# Patient Record
Sex: Female | Born: 1955 | Race: White | Hispanic: No | Marital: Single | Smoking: Never smoker
Health system: Southern US, Community
[De-identification: ages and names within clinical notes are randomized; demographics above are authoritative.]

## PROBLEM LIST (undated history)

## (undated) DIAGNOSIS — N83201 Unspecified ovarian cyst, right side: Secondary | ICD-10-CM

## (undated) DIAGNOSIS — N83202 Unspecified ovarian cyst, left side: Secondary | ICD-10-CM

## (undated) DIAGNOSIS — F329 Major depressive disorder, single episode, unspecified: Secondary | ICD-10-CM

## (undated) DIAGNOSIS — F419 Anxiety disorder, unspecified: Secondary | ICD-10-CM

## (undated) DIAGNOSIS — K219 Gastro-esophageal reflux disease without esophagitis: Secondary | ICD-10-CM

## (undated) DIAGNOSIS — B001 Herpesviral vesicular dermatitis: Secondary | ICD-10-CM

## (undated) DIAGNOSIS — G250 Essential tremor: Secondary | ICD-10-CM

## (undated) DIAGNOSIS — M1990 Unspecified osteoarthritis, unspecified site: Secondary | ICD-10-CM

## (undated) DIAGNOSIS — Z8719 Personal history of other diseases of the digestive system: Secondary | ICD-10-CM

## (undated) DIAGNOSIS — E785 Hyperlipidemia, unspecified: Secondary | ICD-10-CM

## (undated) DIAGNOSIS — F32A Depression, unspecified: Secondary | ICD-10-CM

## (undated) DIAGNOSIS — R251 Tremor, unspecified: Secondary | ICD-10-CM

## (undated) DIAGNOSIS — Z973 Presence of spectacles and contact lenses: Secondary | ICD-10-CM

## (undated) HISTORY — PX: ABDOMINAL HYSTERECTOMY: SHX81

## (undated) HISTORY — PX: JOINT REPLACEMENT: SHX530

## (undated) HISTORY — DX: Tremor, unspecified: R25.1

## (undated) HISTORY — DX: Major depressive disorder, single episode, unspecified: F32.9

## (undated) HISTORY — PX: COSMETIC SURGERY: SHX468

## (undated) HISTORY — PX: BREAST SURGERY: SHX581

## (undated) HISTORY — DX: Hyperlipidemia, unspecified: E78.5

## (undated) HISTORY — PX: OTHER SURGICAL HISTORY: SHX169

## (undated) HISTORY — DX: Herpesviral vesicular dermatitis: B00.1

## (undated) HISTORY — DX: Depression, unspecified: F32.A

---

## 1997-06-10 HISTORY — PX: BREAST ENHANCEMENT SURGERY: SHX7

## 1998-05-18 ENCOUNTER — Other Ambulatory Visit: Admission: RE | Admit: 1998-05-18 | Discharge: 1998-05-18 | Payer: Self-pay | Admitting: Obstetrics and Gynecology

## 1998-06-10 HISTORY — PX: BREAST SURGERY: SHX581

## 1998-07-03 ENCOUNTER — Inpatient Hospital Stay (HOSPITAL_COMMUNITY): Admission: RE | Admit: 1998-07-03 | Discharge: 1998-07-04 | Payer: Self-pay | Admitting: Obstetrics and Gynecology

## 1998-07-04 ENCOUNTER — Encounter: Payer: Self-pay | Admitting: Obstetrics and Gynecology

## 1999-04-10 ENCOUNTER — Ambulatory Visit (HOSPITAL_BASED_OUTPATIENT_CLINIC_OR_DEPARTMENT_OTHER): Admission: RE | Admit: 1999-04-10 | Discharge: 1999-04-10 | Payer: Self-pay | Admitting: Surgery

## 1999-06-11 HISTORY — PX: ABDOMINAL HYSTERECTOMY: SHX81

## 1999-06-11 HISTORY — PX: LAPAROSCOPIC ASSISTED VAGINAL HYSTERECTOMY: SHX5398

## 1999-06-14 ENCOUNTER — Other Ambulatory Visit: Admission: RE | Admit: 1999-06-14 | Discharge: 1999-06-14 | Payer: Self-pay | Admitting: Obstetrics and Gynecology

## 2000-07-24 ENCOUNTER — Other Ambulatory Visit: Admission: RE | Admit: 2000-07-24 | Discharge: 2000-07-24 | Payer: Self-pay | Admitting: Obstetrics and Gynecology

## 2000-12-17 LAB — HM DEXA SCAN: HM Dexa Scan: NORMAL

## 2001-08-04 ENCOUNTER — Other Ambulatory Visit: Admission: RE | Admit: 2001-08-04 | Discharge: 2001-08-04 | Payer: Self-pay | Admitting: Obstetrics and Gynecology

## 2002-08-03 ENCOUNTER — Other Ambulatory Visit: Admission: RE | Admit: 2002-08-03 | Discharge: 2002-08-03 | Payer: Self-pay | Admitting: Obstetrics and Gynecology

## 2004-01-30 ENCOUNTER — Other Ambulatory Visit: Admission: RE | Admit: 2004-01-30 | Discharge: 2004-01-30 | Payer: Self-pay | Admitting: Obstetrics and Gynecology

## 2005-02-19 ENCOUNTER — Other Ambulatory Visit: Admission: RE | Admit: 2005-02-19 | Discharge: 2005-02-19 | Payer: Self-pay | Admitting: Obstetrics and Gynecology

## 2005-06-10 HISTORY — PX: BLEPHAROPLASTY: SUR158

## 2009-06-10 HISTORY — PX: COLONOSCOPY: SHX174

## 2010-02-27 LAB — HM COLONOSCOPY

## 2010-12-03 ENCOUNTER — Emergency Department (HOSPITAL_COMMUNITY)
Admission: EM | Admit: 2010-12-03 | Discharge: 2010-12-03 | Disposition: A | Discharge status: Home / Self Care | Payer: BC Managed Care – PPO | Attending: Emergency Medicine | Admitting: Emergency Medicine

## 2010-12-03 DIAGNOSIS — K2970 Gastritis, unspecified, without bleeding: Secondary | ICD-10-CM | POA: Insufficient documentation

## 2010-12-05 ENCOUNTER — Other Ambulatory Visit: Payer: Self-pay | Admitting: Emergency Medicine

## 2010-12-05 DIAGNOSIS — R110 Nausea: Secondary | ICD-10-CM

## 2010-12-07 ENCOUNTER — Ambulatory Visit
Admission: RE | Admit: 2010-12-07 | Discharge: 2010-12-07 | Disposition: A | Discharge status: Home / Self Care | Payer: BC Managed Care – PPO | Source: Ambulatory Visit | Attending: Emergency Medicine | Admitting: Emergency Medicine

## 2010-12-07 DIAGNOSIS — R110 Nausea: Secondary | ICD-10-CM

## 2011-04-18 ENCOUNTER — Encounter: Payer: Self-pay | Admitting: Emergency Medicine

## 2011-04-18 DIAGNOSIS — F32A Depression, unspecified: Secondary | ICD-10-CM | POA: Insufficient documentation

## 2011-04-18 DIAGNOSIS — R251 Tremor, unspecified: Secondary | ICD-10-CM | POA: Insufficient documentation

## 2011-04-18 DIAGNOSIS — E785 Hyperlipidemia, unspecified: Secondary | ICD-10-CM | POA: Insufficient documentation

## 2011-04-18 DIAGNOSIS — B001 Herpesviral vesicular dermatitis: Secondary | ICD-10-CM | POA: Insufficient documentation

## 2011-04-18 DIAGNOSIS — F329 Major depressive disorder, single episode, unspecified: Secondary | ICD-10-CM | POA: Insufficient documentation

## 2011-10-14 ENCOUNTER — Other Ambulatory Visit: Payer: Self-pay | Admitting: Emergency Medicine

## 2011-10-14 ENCOUNTER — Other Ambulatory Visit: Payer: Self-pay

## 2011-10-14 MED ORDER — ALPRAZOLAM 0.5 MG PO TABS: 0.5000 mg | ORAL_TABLET | Freq: Every evening | ORAL | Status: DC | PRN

## 2011-10-15 NOTE — Telephone Encounter (Signed)
Needs ov

## 2011-11-16 ENCOUNTER — Other Ambulatory Visit: Payer: Self-pay | Admitting: Emergency Medicine

## 2011-11-19 ENCOUNTER — Other Ambulatory Visit: Payer: Self-pay | Admitting: Physician Assistant

## 2011-11-19 MED ORDER — ALPRAZOLAM 0.5 MG PO TABS: 0.5000 mg | ORAL_TABLET | Freq: Every evening | ORAL | Status: DC | PRN

## 2011-12-11 ENCOUNTER — Ambulatory Visit (INDEPENDENT_AMBULATORY_CARE_PROVIDER_SITE_OTHER): Payer: BC Managed Care – PPO | Admitting: Emergency Medicine

## 2011-12-11 VITALS — BP 128/82 | HR 84 | Temp 98.1°F | Resp 16 | Ht 64.5 in | Wt 129.0 lb

## 2011-12-11 DIAGNOSIS — R079 Chest pain, unspecified: Secondary | ICD-10-CM

## 2011-12-11 DIAGNOSIS — F329 Major depressive disorder, single episode, unspecified: Secondary | ICD-10-CM

## 2011-12-11 DIAGNOSIS — F32A Depression, unspecified: Secondary | ICD-10-CM

## 2011-12-11 DIAGNOSIS — B009 Herpesviral infection, unspecified: Secondary | ICD-10-CM

## 2011-12-11 DIAGNOSIS — K219 Gastro-esophageal reflux disease without esophagitis: Secondary | ICD-10-CM

## 2011-12-11 DIAGNOSIS — R059 Cough, unspecified: Secondary | ICD-10-CM

## 2011-12-11 DIAGNOSIS — R05 Cough: Secondary | ICD-10-CM

## 2011-12-11 DIAGNOSIS — J329 Chronic sinusitis, unspecified: Secondary | ICD-10-CM

## 2011-12-11 DIAGNOSIS — F3289 Other specified depressive episodes: Secondary | ICD-10-CM

## 2011-12-11 MED ORDER — BUPROPION HCL ER (XL) 300 MG PO TB24: 300.0000 mg | ORAL_TABLET | ORAL | Status: DC

## 2011-12-11 MED ORDER — ALPRAZOLAM 0.5 MG PO TABS: 0.5000 mg | ORAL_TABLET | Freq: Every evening | ORAL | Status: DC | PRN

## 2011-12-11 MED ORDER — VALACYCLOVIR HCL 1 G PO TABS: 1000.0000 mg | ORAL_TABLET | Freq: Every day | ORAL | Status: DC

## 2011-12-11 MED ORDER — LEVOFLOXACIN 500 MG PO TABS: 500.0000 mg | ORAL_TABLET | Freq: Every day | ORAL | Status: AC

## 2011-12-11 MED ORDER — FLUTICASONE PROPIONATE 50 MCG/ACT NA SUSP: 2.0000 | Freq: Every day | NASAL | Status: DC

## 2011-12-11 MED ORDER — ALBUTEROL SULFATE (2.5 MG/3ML) 0.083% IN NEBU
2.5000 mg | INHALATION_SOLUTION | Freq: Once | RESPIRATORY_TRACT | Status: AC
  Administered 2011-12-11: 2.5 mg via RESPIRATORY_TRACT

## 2011-12-11 MED ORDER — BENZONATATE 200 MG PO CAPS: 200.0000 mg | ORAL_CAPSULE | Freq: Two times a day (BID) | ORAL | Status: AC | PRN

## 2011-12-11 NOTE — Progress Notes (Signed)
Subjective:    Patient ID: Yvette Ferguson, female    DOB: 04-27-1956, 56 y.o.   MRN: [id number]  HPI patient has been sick for at least one week she has chest congestion head congestion significant cough. She denies been ill with fever chills or other symptoms she states no one else at home is sick.  She has not had a fever.    Review of Systems     Objective:   Physical Exam  Constitutional: She appears well-developed and well-nourished.  Eyes: Pupils are equal, round, and reactive to light.  Neck: No thyromegaly present.  Cardiovascular: Normal rate and regular rhythm.   Pulmonary/Chest: Effort normal. No respiratory distress. She has wheezes.       There is mild prolongation of expiration with a peak flow of 390.          Assessment & Plan:   Testing patient did not have any improvement with her and nebulizer. We'll treat with Levaquin 500 one a day Flonase and Tessalon Perles. We'll treat it as a sinusitis

## 2011-12-11 NOTE — Patient Instructions (Addendum)

## 2011-12-23 ENCOUNTER — Other Ambulatory Visit: Payer: Self-pay | Admitting: Gastroenterology

## 2011-12-23 DIAGNOSIS — R110 Nausea: Secondary | ICD-10-CM

## 2011-12-23 DIAGNOSIS — R1013 Epigastric pain: Secondary | ICD-10-CM

## 2011-12-25 ENCOUNTER — Ambulatory Visit
Admission: RE | Admit: 2011-12-25 | Discharge: 2011-12-25 | Disposition: A | Discharge status: Home / Self Care | Payer: BC Managed Care – PPO | Source: Ambulatory Visit | Attending: Gastroenterology | Admitting: Gastroenterology

## 2011-12-25 DIAGNOSIS — R110 Nausea: Secondary | ICD-10-CM

## 2011-12-25 DIAGNOSIS — R1013 Epigastric pain: Secondary | ICD-10-CM

## 2011-12-27 ENCOUNTER — Telehealth: Payer: Self-pay

## 2011-12-27 NOTE — Telephone Encounter (Signed)
Pt CB and stated that she has seen Dr Elnoria Howard and he had gotten the [place] results so she was aware of them. He wants her to cont on Prilosec and f/up w him on 01/22/12

## 2011-12-27 NOTE — Telephone Encounter (Signed)
LMOM to CB. Dr Cleta Alberts wanted [place] to call pt w/normal [place] Abd results - no sign of gallstones (results at [facility] desk), and to check if she has seen the GI yet. In [facility] looks like she saw GI and he has ordered an [place] Abd. Does he think pt needs another one, or do we need to send results to GI?

## 2012-04-26 ENCOUNTER — Other Ambulatory Visit: Payer: Self-pay | Admitting: Emergency Medicine

## 2012-05-06 ENCOUNTER — Telehealth: Payer: Self-pay | Admitting: *Deleted

## 2012-05-06 ENCOUNTER — Ambulatory Visit: Payer: BC Managed Care – PPO | Admitting: Family Medicine

## 2012-05-06 DIAGNOSIS — F329 Major depressive disorder, single episode, unspecified: Secondary | ICD-10-CM

## 2012-05-06 DIAGNOSIS — F32A Depression, unspecified: Secondary | ICD-10-CM

## 2012-05-06 MED ORDER — BUPROPION HCL ER (XL) 300 MG PO TB24: 300.0000 mg | ORAL_TABLET | ORAL | Status: DC

## 2012-05-06 NOTE — Telephone Encounter (Signed)
Rx done. 

## 2012-05-06 NOTE — Telephone Encounter (Signed)
Thanks, I have pended this for patient. See previous.

## 2012-05-06 NOTE — Telephone Encounter (Signed)
Patient advised.

## 2012-05-06 NOTE — Telephone Encounter (Signed)
Pt. Had an 11:45 appt. with Dr. Audria Nine today, I had to call pt to rescheduled her appt. due to the Dr. cancelling her appointments today. Pt stated that she had tried to refill her wellbutrin and they told her she needed an appt so she made this one. Pt has been without her medication now for three days and we rescheduled her appointment for Dec 4th. Pt needs a refill on this med before then. I told her I would try to help out due to this was not her fault her appt was cancelled for today.

## 2012-05-13 ENCOUNTER — Encounter: Payer: Self-pay | Admitting: Family Medicine

## 2012-05-13 ENCOUNTER — Ambulatory Visit (INDEPENDENT_AMBULATORY_CARE_PROVIDER_SITE_OTHER): Payer: BC Managed Care – PPO | Admitting: Family Medicine

## 2012-05-13 VITALS — BP 133/86 | HR 81 | Temp 98.0°F | Resp 16 | Ht 64.0 in | Wt 132.0 lb

## 2012-05-13 DIAGNOSIS — F3289 Other specified depressive episodes: Secondary | ICD-10-CM

## 2012-05-13 DIAGNOSIS — R1013 Epigastric pain: Secondary | ICD-10-CM | POA: Insufficient documentation

## 2012-05-13 DIAGNOSIS — F329 Major depressive disorder, single episode, unspecified: Secondary | ICD-10-CM

## 2012-05-13 DIAGNOSIS — K30 Functional dyspepsia: Secondary | ICD-10-CM | POA: Insufficient documentation

## 2012-05-13 DIAGNOSIS — Z780 Asymptomatic menopausal state: Secondary | ICD-10-CM | POA: Insufficient documentation

## 2012-05-13 DIAGNOSIS — F32A Depression, unspecified: Secondary | ICD-10-CM

## 2012-05-13 DIAGNOSIS — Z760 Encounter for issue of repeat prescription: Secondary | ICD-10-CM

## 2012-05-13 MED ORDER — BUPROPION HCL ER (XL) 300 MG PO TB24: 300.0000 mg | ORAL_TABLET | ORAL | Status: DC

## 2012-05-13 MED ORDER — ALPRAZOLAM 0.5 MG PO TABS: 0.5000 mg | ORAL_TABLET | Freq: Every evening | ORAL | Status: DC | PRN

## 2012-05-13 NOTE — Patient Instructions (Addendum)
For menopause symptoms- get an herbal supplement that contains Black cohosh, evening primrose oil and chastetree berry. Herbal supplements work best when combined with herbs that have similar actions in the body.  I have found that Flax Seed Oil 1000-1200 mg  1 capsule daily is helpful. This is also a good source of Omega-3 Fish Oil.   Menopause Menopause is the normal time of life when menstrual periods stop completely. Menopause is complete when you have missed 12 consecutive menstrual periods. It usually occurs between the ages of 73 to 65, with an average age of 28. Very rarely does a woman develop menopause before 55 years old. At menopause, your ovaries stop producing the female hormones, estrogen and progesterone. This can cause undesirable symptoms and also affect your health. Sometimes the symptoms may occur 4 to 5 years before the menopause begins. There is no relationship between menopause and:  Oral contraceptives.  Number of children you had.  Race.  The age your menstrual periods started (menarche). Heavy smokers and very thin women may develop menopause earlier in life. CAUSES  The ovaries stop producing the female hormones estrogen and progesterone.  Other causes include:  Surgery to remove both ovaries.  The ovaries stop functioning for no known reason.  Tumors of the pituitary gland in the brain.  Medical disease that affects the ovaries and hormone production.  Radiation treatment to the abdomen or pelvis.  Chemotherapy that affects the ovaries. SYMPTOMS   Hot flashes.  Night sweats.  Decrease in sex drive.  Vaginal dryness and thinning of the vagina causing painful intercourse.  Dryness of the skin and developing wrinkles.  Headaches.  Tiredness.  Irritability.  Memory problems.  Weight gain.  Bladder infections.  Hair growth of the face and chest.  Infertility. More serious symptoms include:  Loss of bone (osteoporosis) causing breaks  (fractures).  Depression.  Hardening and narrowing of the arteries (atherosclerosis) causing heart attacks and strokes. DIAGNOSIS   When the menstrual periods have stopped for 12 straight months.  Physical exam.  Hormone studies of the blood. TREATMENT  There are many treatment choices and nearly as many questions about them. The decisions to treat or not to treat menopausal changes is an individual choice made with your caregiver. Your caregiver can discuss the treatments with you. Together, you can decide which treatment will work best for you. Your treatment choices may include:   Hormone therapy (estorgen and progesterone).  Non-hormonal medications.  Treating the individual symptoms with medication (for example antidepressants for depression).  Herbal medications that may help specific symptoms.  Counseling by a psychiatrist or psychologist.  Group therapy.  Lifestyle changes including:  Eating healthy.  Regular exercise.  Limiting caffeine and alcohol.  Stress management and meditation.  No treatment. HOME CARE INSTRUCTIONS   Take the medication your caregiver gives you as directed.  Get plenty of sleep and rest.  Exercise regularly.  Eat a diet that contains calcium (good for the bones) and soy products (acts like estrogen hormone).  Avoid alcoholic beverages.  Do not smoke.  If you have hot flashes, dress in layers.  Take supplements, calcium and vitamin D to strengthen bones.  You can use over-the-counter lubricants or moisturizers for vaginal dryness.  Group therapy is sometimes very helpful.  Acupuncture may be helpful in some cases. SEEK MEDICAL CARE IF:   You are not sure you are in menopause.  You are having menopausal symptoms and need advice and treatment.  You are still having menstrual  periods after age 33.  You have pain with intercourse.  Menopause is complete (no menstrual period for 12 months) and you develop vaginal  bleeding.  You need a referral to a specialist (gynecologist, psychiatrist or psychologist) for treatment. SEEK IMMEDIATE MEDICAL CARE IF:   You have severe depression.  You have excessive vaginal bleeding.  You fell and think you have a broken bone.  You have pain when you urinate.  You develop leg or chest pain.  You have a fast pounding heart beat (palpitations).  You have severe headaches.  You develop vision problems.  You feel a lump in your breast.  You have abdominal pain or severe indigestion. Document Released: 08/17/2003 Document Revised: 08/19/2011 Document Reviewed: 03/24/2008 [facility] Patient Information 2013 [place], [place].

## 2012-05-13 NOTE — Progress Notes (Signed)
S:  Pt has chronic depression for years with increased symptoms during fall and winter. She has been on Bupropion since 2009. She is having no side effects w/ this medication. Menopause symptoms have been problematic for last 2 1/2 years. GYN physician has recommended Black cohosh. In general, she feels she is doing well handling life's challenges. Alprazolam helps with sleep 3 nights/week. She saw Dr. Elnoria Howard earlier this year and has been advised to stay on Prilosec; she did go off the med for several weeks but resumed it this week.  ROS: Negative for appetite change, weight loss, fatigue, CP or tightness, palpitations, SOB or DOE, abd pain or new GI symptoms, abnormal thoughts or behaviors, difficulty w/ focus, SI or irritability.  O:  Filed Vitals:   05/13/12 1206  BP: 133/86  Pulse: 81  Temp: 98 F (36.7 C)  Resp: 16   GEN: In NAD; WN,WD. HENT: [place]/AT; EOMI w/ clear conj/scl. Otherwise normal. COR: RRR. LUNGS: Normal resp rate and effort. NEURO: A&O x 3; CNs intact. Nonfocal.   A/P: 1. Depression  buPROPion (WELLBUTRIN XL) 300 MG 24 hr tablet, ALPRAZolam (XANAX) 0.5 MG tablet  2. Menopause  Advise given re: OTC herbal supplements.  3. Encounter for medication refill

## 2012-06-09 ENCOUNTER — Other Ambulatory Visit: Payer: Self-pay | Admitting: Physician Assistant

## 2012-07-13 ENCOUNTER — Other Ambulatory Visit: Payer: Self-pay | Admitting: Emergency Medicine

## 2012-11-11 ENCOUNTER — Encounter: Payer: Self-pay | Admitting: Emergency Medicine

## 2012-12-08 ENCOUNTER — Ambulatory Visit (INDEPENDENT_AMBULATORY_CARE_PROVIDER_SITE_OTHER): Payer: BC Managed Care – PPO | Admitting: Family Medicine

## 2012-12-08 VITALS — BP 144/83 | HR 66 | Temp 98.1°F | Resp 16 | Ht 65.0 in | Wt 124.0 lb

## 2012-12-08 DIAGNOSIS — F32A Depression, unspecified: Secondary | ICD-10-CM

## 2012-12-08 DIAGNOSIS — E785 Hyperlipidemia, unspecified: Secondary | ICD-10-CM

## 2012-12-08 DIAGNOSIS — M25572 Pain in left ankle and joints of left foot: Secondary | ICD-10-CM

## 2012-12-08 DIAGNOSIS — B009 Herpesviral infection, unspecified: Secondary | ICD-10-CM

## 2012-12-08 DIAGNOSIS — K2970 Gastritis, unspecified, without bleeding: Secondary | ICD-10-CM

## 2012-12-08 DIAGNOSIS — F329 Major depressive disorder, single episode, unspecified: Secondary | ICD-10-CM

## 2012-12-08 DIAGNOSIS — F3289 Other specified depressive episodes: Secondary | ICD-10-CM

## 2012-12-08 MED ORDER — ALPRAZOLAM 0.5 MG PO TABS: 0.5000 mg | ORAL_TABLET | Freq: Every evening | ORAL | Status: DC | PRN

## 2012-12-08 MED ORDER — BUPROPION HCL ER (XL) 300 MG PO TB24: 300.0000 mg | ORAL_TABLET | ORAL | Status: DC

## 2012-12-08 MED ORDER — VALACYCLOVIR HCL 1 G PO TABS: 1000.0000 mg | ORAL_TABLET | Freq: Every day | ORAL | Status: DC

## 2012-12-08 NOTE — Progress Notes (Signed)
[facility]:  Office Visit  Chief Complaint:  Chief Complaint  Patient presents with  . Foot Pain    Left foot painful noticed last night  . Medication Refill    Has appt with Dr. Cleta Alberts on 7/15, but wants to know if we can RF her meds today so she doesn't have to pay another copay-plus she doesn't have enough meds to last up until the 15    HPI: Yvette Ferguson is a 57 y.o. female who complains of: 1. Left foot pain-started last night NKI, she has a knot on the left lateral and also medial aspect and her ankle is swollen. No new meds, No h/o gout. No increase in salt. + h/o bursitis. 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 Works barefoot. Aching pain radiating up her foot to her leg, not a sharp pain, + minimal numbness/tingling. Dull ache. Has not tried anything for it.  2. She has a h/o depression and has been on many antidepressants, wellbutrin has worked best for her. She has had more episodes of irritability but does not want to change the medicine. Sister recnetly passed from Stage 4 pancreatic cancer  Past Medical History  Diagnosis Date  . Depression   . Herpes labialis   . Tremor   . Hyperlipidemia    Past Surgical History  Procedure Laterality Date  . Abdominal hysterectomy  2001  . Eyelid surgery    . Breast surgery  2000   History   Social History  . Marital Status: Married    Spouse Name: N/A    Number of Children: N/A  . Years of Education: N/A   Social History Main Topics  . Smoking status: Never Smoker   . Smokeless tobacco: None  . Alcohol Use: Yes     Comment: occassionally  . Drug Use: No  . Sexually Active: Yes   Other Topics Concern  . None   Social History Narrative  . None   Family History  Problem Relation Age of Onset  . Diabetes Mother   . Pancreatic cancer Mother   . Hypertension Mother   . Diabetes Father   . Leukemia Father   . Hypertension Father    No Known Allergies Prior to  Admission medications   Medication Sig Start Date End Date Taking? Authorizing Provider  ALPRAZolam Prudy Feeler) 0.5 MG tablet Take 1 tablet (0.5 mg total) by mouth at bedtime as needed. 05/13/12  Yes Maurice March, MD  buPROPion (WELLBUTRIN XL) 300 MG 24 hr tablet Take 1 tablet (300 mg total) by mouth every morning. 05/13/12  Yes Maurice March, MD  Multiple Vitamin (MULTIVITAMIN) tablet Take 1 tablet by mouth daily.   Yes Historical Provider, MD  Omeprazole Magnesium (PRILOSEC OTC PO) Take 20 mg by mouth as needed.    Yes Historical Provider, MD  valACYclovir (VALTREX) 1000 MG tablet Take 1 tablet (1,000 mg total) by mouth daily. 12/11/11  Yes [name], MD     ROS: The patient denies fevers, chills, night sweats, unintentional weight loss, chest pain, palpitations, wheezing, dyspnea on exertion, nausea, vomiting, abdominal pain, dysuria, hematuria, melena,  weakness All other systems have been reviewed and were otherwise negative with the exception of those mentioned in the HPI and as above.    PHYSICAL EXAM: Filed Vitals:   12/08/12 1918  BP: 144/83  Pulse: 66  Temp: 98.1 F (36.7 C)  Resp: 16   Filed Vitals:  12/08/12 1918  Height: 5\' 5"  (1.651 m)  Weight: 124 lb (56.246 kg)   Body mass index is 20.63 kg/(m^2).  General: Alert, no acute distress HEENT:  Normocephalic, atraumatic, oropharynx patent.  Cardiovascular:  Regular rate and rhythm, no rubs murmurs or gallops.  No Carotid bruits, radial pulse intact. No pedal edema.  Respiratory: Clear to auscultation bilaterally.  No wheezes, rales, or rhonchi.  No cyanosis, no use of accessory musculature GI: No organomegaly, abdomen is soft and non-tender, positive bowel sounds.  No masses. Skin: No rashes. Neurologic: Facial musculature symmetric. Psychiatric: Patient is appropriate throughout our interaction. Lymphatic: No cervical lymphadenopathy Musculoskeletal: Gait intact. Left foot- mobile 5-10 mm nodule, cyst like  on left lateral tendon peroneus brevis close to base of 5th MTP , also boney prominence on medial aspect of foot along navicular bone Full ROM, no pain with ROM, 5/5 strength, 2/2 DTRs, + DP No pain with dorsi or plantar flexion , nontender along PF or heel   LABS: Results for orders placed in visit on 12/08/12  COMPREHENSIVE METABOLIC PANEL      Result Value Range   Sodium 139  135 - 145 mEq/L   Potassium 4.3  3.5 - 5.3 mEq/L   Chloride 103  96 - 112 mEq/L   CO2 28  19 - 32 mEq/L   Glucose, Bld 86  70 - 99 mg/dL   BUN 16  6 - 23 mg/dL   Creat 2.13  0.86 - 5.78 mg/dL   Total Bilirubin 0.3  0.3 - 1.2 mg/dL   Alkaline Phosphatase 47  39 - 117 U/L   AST 20  0 - 37 U/L   ALT 17  0 - 35 U/L   Total Protein 6.9  6.0 - 8.3 g/dL   Albumin 4.7  3.5 - 5.2 g/dL   Calcium 9.6  8.4 - 46.9 mg/dL  LIPASE      Result Value Range   Lipase 40  0 - 75 U/L  LDL CHOLESTEROL, DIRECT      Result Value Range   Direct LDL 163 (*)      EKG/XRAY:   Primary read interpreted by Dr. Conley Rolls at [facility].   ASSESSMENT/PLAN: Encounter Diagnoses  Name Primary?  . Hyperlipemia Yes  . Gastritis   . HSV infection   . Depression   . Pain in joint, ankle and foot, left     She is doing ok on Wellbutrin, doe snot want to change meds at this time Refilled Valtrex an also Wellbutrin and Alprazolam She is concerned about pancreatic cancer, sister and mom died from it. HEr sister just passed away 2 days ago She wants to get some basic blood work to make sure that her pancreas is working ok, those labs will be order under her prior PMH of  hyperlipidemia and also gastritis RICE and NSAIDs prn for foot pain which I think is a ganglion cyst along the extensor tendon of lateral foot along pernoeus brevis tednon and the other area of medial  pain is from arthritis.  F/u in 6 months or prn   LE, THAO PHUONG, DO 12/10/2012 11:23 AM  D/w patient labs and need for f/u in 6 months. Aggressive XOL controll with diet and  exercise, repeat fasting lipid in 6 months.

## 2012-12-09 ENCOUNTER — Other Ambulatory Visit: Payer: Self-pay | Admitting: Family Medicine

## 2012-12-09 LAB — COMPREHENSIVE METABOLIC PANEL
ALT: 17 U/L (ref 0–35)
AST: 20 U/L (ref 0–37)
Albumin: 4.7 g/dL (ref 3.5–5.2)
Alkaline Phosphatase: 47 U/L (ref 39–117)
BUN: 16 mg/dL (ref 6–23)
CO2: 28 mEq/L (ref 19–32)
Calcium: 9.6 mg/dL (ref 8.4–10.5)
Chloride: 103 mEq/L (ref 96–112)
Creat: 1.01 mg/dL (ref 0.50–1.10)
Glucose, Bld: 86 mg/dL (ref 70–99)
Potassium: 4.3 mEq/L (ref 3.5–5.3)
Sodium: 139 mEq/L (ref 135–145)
Total Bilirubin: 0.3 mg/dL (ref 0.3–1.2)
Total Protein: 6.9 g/dL (ref 6.0–8.3)

## 2012-12-09 LAB — LIPASE: Lipase: 40 U/L (ref 0–75)

## 2012-12-09 LAB — LDL CHOLESTEROL, DIRECT: Direct LDL: 163 mg/dL — ABNORMAL HIGH

## 2012-12-22 ENCOUNTER — Ambulatory Visit (INDEPENDENT_AMBULATORY_CARE_PROVIDER_SITE_OTHER): Payer: BC Managed Care – PPO | Admitting: Emergency Medicine

## 2012-12-22 ENCOUNTER — Encounter: Payer: Self-pay | Admitting: Emergency Medicine

## 2012-12-22 VITALS — BP 120/72 | HR 69 | Temp 98.5°F | Resp 16 | Ht 64.5 in | Wt 124.0 lb

## 2012-12-22 DIAGNOSIS — R1013 Epigastric pain: Secondary | ICD-10-CM

## 2012-12-22 DIAGNOSIS — F32A Depression, unspecified: Secondary | ICD-10-CM

## 2012-12-22 DIAGNOSIS — H9313 Tinnitus, bilateral: Secondary | ICD-10-CM

## 2012-12-22 DIAGNOSIS — F329 Major depressive disorder, single episode, unspecified: Secondary | ICD-10-CM

## 2012-12-22 DIAGNOSIS — F3289 Other specified depressive episodes: Secondary | ICD-10-CM

## 2012-12-22 DIAGNOSIS — G8929 Other chronic pain: Secondary | ICD-10-CM

## 2012-12-22 NOTE — Progress Notes (Signed)
Subjective:    Patient ID: Yvette Ferguson, female    DOB: 1956-04-09, 57 y.o.   MRN: [id number]  HPI Patient presents today very emotional.  States mother passed away eighteen years ago of pancreatic cancer at age 39 and sister recently passed away of pancreatic cancer at age 44.  Not sure if medications are working properly.  States she asked her husband if he would like to go to counciling but he does not want to go with her.  Advised patient to follow through with counciling whether or not husband goes.  Recommended Karmen Bongo or Vernell Leep.  States she saw Karmen Bongo in the past but might like to start new with someone else.  Has questions about LDL cholesterol.  There is also a blood test for pancreatic cancer tumor marker.  It is hereditary.  We could scan the pancrease to see if there are any abnormalities.  Patient expressed concerns about whether insurance would cover scan.  Advised her that it should because she has abdominal pain. She was evaluated by Dr. Audley Hose. Her initial diagnosis was gastritis. He did have an ultrasound done to    Review of Systems     Objective:   Physical Exam neck is supple chest clear heart regular rate no murmurs. abdomen soft nontender. Her TMs are normal .       Assessment & Plan:  We'll proceed with CT abdomen and pelvis for abdominal pain and family history of pancreatic cancer. She is to check with her insurance carrier to see if they would cover the tumor marker for pancreatic CA. She will be on a low-fat diet to consider addition of facial control of rejection 3 months and if still elevated at that time will add a statin .

## 2012-12-23 ENCOUNTER — Telehealth: Payer: Self-pay

## 2012-12-23 NOTE — Telephone Encounter (Signed)
PT WOULD LIKE A CALL BACK FROM DR DAUB REGARDING HER VISIT FROM YESTERDAY. PLEASE CALL [phone]

## 2012-12-24 NOTE — Telephone Encounter (Signed)
Patient wants to know if she can have the blood work CA 19-9 was told blood work could be done at specialist and it would be covered by her [employer].  But she wants to know if she can come here for this? Also she would like to know if she needs the labs if the CT comes back negative?

## 2012-12-24 NOTE — Telephone Encounter (Signed)
Please call patient and let her know let's do the CT scan first and then I will discuss with her about doing the blood work. I am certainly happy to do the tumor marker screening

## 2012-12-24 NOTE — Telephone Encounter (Signed)
Thank you I have advised patient  

## 2012-12-25 ENCOUNTER — Telehealth: Payer: Self-pay | Admitting: Emergency Medicine

## 2012-12-25 ENCOUNTER — Ambulatory Visit
Admission: RE | Admit: 2012-12-25 | Discharge: 2012-12-25 | Disposition: A | Discharge status: Home / Self Care | Payer: BC Managed Care – PPO | Source: Ambulatory Visit | Attending: Emergency Medicine | Admitting: Emergency Medicine

## 2012-12-25 DIAGNOSIS — G8929 Other chronic pain: Secondary | ICD-10-CM

## 2012-12-25 MED ORDER — IOHEXOL 300 MG/ML  SOLN
100.0000 mL | Freq: Once | INTRAMUSCULAR | Status: AC | PRN
  Administered 2012-12-25: 100 mL via INTRAVENOUS

## 2012-12-25 NOTE — Telephone Encounter (Signed)
Please call patient and let her now her CT did not show any signs of a pancreatic mass. We can  do a CA 19 on her followup visit. But there was no evidence of any pancreatic tumor on her scan .

## 2012-12-26 NOTE — Telephone Encounter (Signed)
Pt notified of results

## 2013-06-21 ENCOUNTER — Ambulatory Visit: Payer: BC Managed Care – PPO

## 2013-06-21 ENCOUNTER — Ambulatory Visit (INDEPENDENT_AMBULATORY_CARE_PROVIDER_SITE_OTHER): Payer: BC Managed Care – PPO | Admitting: Emergency Medicine

## 2013-06-21 VITALS — BP 120/76 | HR 105 | Temp 100.6°F | Resp 16 | Ht 64.5 in | Wt 122.2 lb

## 2013-06-21 DIAGNOSIS — F3289 Other specified depressive episodes: Secondary | ICD-10-CM

## 2013-06-21 DIAGNOSIS — R509 Fever, unspecified: Secondary | ICD-10-CM

## 2013-06-21 DIAGNOSIS — F329 Major depressive disorder, single episode, unspecified: Secondary | ICD-10-CM

## 2013-06-21 DIAGNOSIS — R52 Pain, unspecified: Secondary | ICD-10-CM

## 2013-06-21 DIAGNOSIS — F32A Depression, unspecified: Secondary | ICD-10-CM

## 2013-06-21 DIAGNOSIS — R05 Cough: Secondary | ICD-10-CM

## 2013-06-21 DIAGNOSIS — R059 Cough, unspecified: Secondary | ICD-10-CM

## 2013-06-21 LAB — POCT INFLUENZA A/B
Influenza A, POC: POSITIVE
Influenza B, POC: NEGATIVE

## 2013-06-21 MED ORDER — ALPRAZOLAM 0.5 MG PO TABS: 0.5000 mg | ORAL_TABLET | Freq: Every evening | ORAL | Status: DC | PRN

## 2013-06-21 MED ORDER — HYDROCODONE-HOMATROPINE 5-1.5 MG/5ML PO SYRP: 5.0000 mL | ORAL_SOLUTION | Freq: Three times a day (TID) | ORAL | Status: DC | PRN

## 2013-06-21 MED ORDER — BENZONATATE 100 MG PO CAPS: 100.0000 mg | ORAL_CAPSULE | Freq: Three times a day (TID) | ORAL | Status: DC | PRN

## 2013-06-21 MED ORDER — BUPROPION HCL ER (XL) 300 MG PO TB24: 300.0000 mg | ORAL_TABLET | ORAL | Status: DC

## 2013-06-21 NOTE — Progress Notes (Signed)
Subjective:    Patient ID: Yvette Ferguson, female    DOB: 10/09/1955, 58 y.o.   MRN: [id number]  HPI presents today with fever, HA, body aches, and cough. Started Wednesday with scratchy throat. She was around one of her friends that day which tested positive for flu after that. Cough was productive but now is just dry. She is coughing hard and deep at times. Fever and cough keep her up at night. Right eye did have d/c this am. Both eyes are red.    Needs refill on Wellbutrin and alprazolam   Review of Systems     Objective:   Physical Exam patient is alert and cooperative. There is a slight reddish discharge from the right. Nose is congested throat is normal chest is clear heart regular rate no murmurs abdomen is soft nontender  [facility] reading (PRIMARY) by  Dr. Cleta Alberts no acute disease  Results for orders placed in visit on 06/21/13  POCT INFLUENZA A/B      Result Value Range   Influenza A, POC Positive     Influenza B, POC Negative          Assessment & Plan:  Use  Hycodan and Tessalon for cough.

## 2013-06-21 NOTE — Patient Instructions (Signed)

## 2013-08-17 ENCOUNTER — Ambulatory Visit: Payer: BC Managed Care – PPO | Admitting: Emergency Medicine

## 2013-09-07 ENCOUNTER — Encounter: Payer: Self-pay | Admitting: Emergency Medicine

## 2013-09-07 ENCOUNTER — Ambulatory Visit (INDEPENDENT_AMBULATORY_CARE_PROVIDER_SITE_OTHER): Payer: BC Managed Care – PPO | Admitting: Emergency Medicine

## 2013-09-07 VITALS — BP 123/82 | HR 84 | Temp 97.9°F | Resp 16 | Ht 64.25 in | Wt 118.2 lb

## 2013-09-07 DIAGNOSIS — E785 Hyperlipidemia, unspecified: Secondary | ICD-10-CM

## 2013-09-07 DIAGNOSIS — F329 Major depressive disorder, single episode, unspecified: Secondary | ICD-10-CM

## 2013-09-07 DIAGNOSIS — F3289 Other specified depressive episodes: Secondary | ICD-10-CM

## 2013-09-07 LAB — COMPLETE METABOLIC PANEL WITH GFR
ALT: 24 U/L (ref 0–35)
AST: 22 U/L (ref 0–37)
Albumin: 4.2 g/dL (ref 3.5–5.2)
Alkaline Phosphatase: 41 U/L (ref 39–117)
BUN: 18 mg/dL (ref 6–23)
CO2: 31 mEq/L (ref 19–32)
Calcium: 9 mg/dL (ref 8.4–10.5)
Chloride: 104 mEq/L (ref 96–112)
Creat: 0.91 mg/dL (ref 0.50–1.10)
GFR, Est African American: 82 mL/min
GFR, Est Non African American: 71 mL/min
Glucose, Bld: 92 mg/dL (ref 70–99)
Potassium: 4.2 mEq/L (ref 3.5–5.3)
Sodium: 141 mEq/L (ref 135–145)
Total Bilirubin: 0.5 mg/dL (ref 0.2–1.2)
Total Protein: 6.4 g/dL (ref 6.0–8.3)

## 2013-09-07 NOTE — Progress Notes (Signed)
This chart was scribed for [name], MD by Joaquin Music, ED Scribe. This patient was seen in room Room/bed 21 and the patient's care was started at 11:34 AM. Subjective:    Patient ID: Yvette Ferguson, female    DOB: 12/27/1955, 58 y.o.   MRN: [id number] Chief Complaint  Patient presents with  . Hyperlipidemia    cholesterol check   HPI Yvette Ferguson is a 58 y.o. female who presents to the [facility] for hyperlipidemia F/U. Pt states she last ate was this morning. She states she suspects her levels are not too well at this moment. Pt states she does have a family hx of hyperlipidemia. Pt states her father had heart disease that began in his early 66's. Pt mother and sister had pancreatic cancer. Pt denies hx of DM. She denies smoking.   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 She states her depression is "okay". She states she has been traveling lately and reports to be moving into her house in July 2015. Pt states she is now starting to exercise.    Review of Systems  All other systems reviewed and are negative.   Objective:   Physical Exam CONSTITUTIONAL: Well developed/well nourished HEAD: Normocephalic/atraumatic EYES: EOMI/PERRL ENMT: Mucous membranes moist NECK: supple no meningeal signs. Thyroid is normal. SPINE:entire spine nontender CV: Regular rate and rhythm , no murmurs/rubs/gallops noted LUNGS: Lungs are clear to auscultation bilaterally, no apparent distress ABDOMEN: soft, nontender, no rebound or guarding GU:no cva tenderness NEURO: Pt is awake/alert, moves all extremitiesx4 EXTREMITIES: pulses normal, full ROM. No edema. SKIN: warm, color normal PSYCH: no abnormalities of mood noted  Triage Vitals:BP 123/82  Pulse 84  Temp(Src) 97.9 F (36.6 C) (Oral)  Resp 16  Ht 5' 4.25" (1.632 m)  Wt 118 lb 3.2 oz (53.615 kg)  BMI 20.13 kg/m2  SpO2 100% Assessment & Plan:  Will do a lipid profile instead of a lipid panel today her  last LDL was 163    I personally performed the services described in this documentation, which was scribed in my presence. The recorded information has been reviewed and is accurate.

## 2013-09-08 LAB — NMR LIPOPROFILE WITH LIPIDS
Cholesterol, Total: 227 mg/dL — ABNORMAL HIGH (ref <200)
HDL Particle Number: 38.7 umol/L (ref >=30.5)
HDL Size: 9.1 nm — ABNORMAL LOW (ref >=9.2)
HDL-C: 70 mg/dL (ref >=40)
LDL (calc): 141 mg/dL — ABNORMAL HIGH (ref <100)
LDL Particle Number: 1799 nmol/L — ABNORMAL HIGH (ref <1000)
LDL Size: 21.2 nm (ref >20.5)
LP-IR Score: 32 (ref <=45)
Large HDL-P: 9.6 umol/L (ref >=4.8)
Large VLDL-P: 1.7 nmol/L (ref <=2.7)
Small LDL Particle Number: 530 nmol/L — ABNORMAL HIGH (ref <=527)
Triglycerides: 82 mg/dL (ref <150)
VLDL Size: 47.9 nm — ABNORMAL HIGH (ref <=46.6)

## 2013-09-11 ENCOUNTER — Other Ambulatory Visit: Payer: Self-pay | Admitting: Emergency Medicine

## 2013-09-11 ENCOUNTER — Telehealth: Payer: Self-pay | Admitting: *Deleted

## 2013-09-11 DIAGNOSIS — E785 Hyperlipidemia, unspecified: Secondary | ICD-10-CM

## 2013-09-11 NOTE — Telephone Encounter (Signed)
Pt called back concerning a previous phone call from lab results. States that she would like to go ahead and have referral to cardiologist .

## 2013-11-04 ENCOUNTER — Telehealth: Payer: Self-pay

## 2013-11-04 DIAGNOSIS — B009 Herpesviral infection, unspecified: Secondary | ICD-10-CM

## 2013-11-04 DIAGNOSIS — F32A Depression, unspecified: Secondary | ICD-10-CM

## 2013-11-04 DIAGNOSIS — F329 Major depressive disorder, single episode, unspecified: Secondary | ICD-10-CM

## 2013-11-04 NOTE — Telephone Encounter (Signed)
Dr. Cleta Alberts   Patient is going out of town for two months and is requesting buPROPion (WELLBUTRIN XL) 300 MG 24 hr tablet And valACYclovir (VALTREX) 1000 MG tablet Be filled to cover this period.   [phone]

## 2013-11-05 MED ORDER — BUPROPION HCL ER (XL) 300 MG PO TB24: 300.0000 mg | ORAL_TABLET | ORAL | Status: DC

## 2013-11-05 MED ORDER — VALACYCLOVIR HCL 1 G PO TABS: 1000.0000 mg | ORAL_TABLET | Freq: Every day | ORAL | Status: DC

## 2013-11-05 NOTE — Telephone Encounter (Signed)
Sent refill of Wellbutrin for 3 months and 1 refill on Valtrex. Pt advised. She will rtc when she returns from out of town.

## 2013-12-23 ENCOUNTER — Other Ambulatory Visit: Payer: Self-pay | Admitting: Physician Assistant

## 2014-02-03 ENCOUNTER — Other Ambulatory Visit: Payer: Self-pay | Admitting: Emergency Medicine

## 2014-02-04 NOTE — Telephone Encounter (Signed)
Called in.

## 2014-03-02 ENCOUNTER — Other Ambulatory Visit: Payer: Self-pay | Admitting: Emergency Medicine

## 2014-03-03 ENCOUNTER — Telehealth: Payer: Self-pay

## 2014-03-03 DIAGNOSIS — F329 Major depressive disorder, single episode, unspecified: Secondary | ICD-10-CM

## 2014-03-03 DIAGNOSIS — F32A Depression, unspecified: Secondary | ICD-10-CM

## 2014-03-03 MED ORDER — BUPROPION HCL ER (XL) 300 MG PO TB24: 300.0000 mg | ORAL_TABLET | ORAL | Status: DC

## 2014-03-03 NOTE — Telephone Encounter (Signed)
Pt made an appt with Dr. Cleta Alberts on 03/08/2014 @ 2:00 pm, but will run out of [facility] before appt..  Pt wanted to know if Dr. Algis Downs could refill medication.  PT [phone]

## 2014-03-08 ENCOUNTER — Ambulatory Visit (INDEPENDENT_AMBULATORY_CARE_PROVIDER_SITE_OTHER): Payer: BC Managed Care – PPO | Admitting: Emergency Medicine

## 2014-03-08 ENCOUNTER — Encounter: Payer: Self-pay | Admitting: Emergency Medicine

## 2014-03-08 VITALS — BP 132/74 | HR 82 | Temp 98.4°F | Resp 18 | Wt 123.0 lb

## 2014-03-08 DIAGNOSIS — F3289 Other specified depressive episodes: Secondary | ICD-10-CM

## 2014-03-08 DIAGNOSIS — F329 Major depressive disorder, single episode, unspecified: Secondary | ICD-10-CM

## 2014-03-08 DIAGNOSIS — F32A Depression, unspecified: Secondary | ICD-10-CM

## 2014-03-08 DIAGNOSIS — E785 Hyperlipidemia, unspecified: Secondary | ICD-10-CM

## 2014-03-08 MED ORDER — VALACYCLOVIR HCL 1 G PO TABS: ORAL_TABLET | ORAL | Status: DC

## 2014-03-08 MED ORDER — ALPRAZOLAM 0.5 MG PO TABS: 0.5000 mg | ORAL_TABLET | Freq: Every evening | ORAL | Status: DC | PRN

## 2014-03-08 MED ORDER — BUPROPION HCL ER (XL) 300 MG PO TB24: 300.0000 mg | ORAL_TABLET | ORAL | Status: DC

## 2014-03-08 MED ORDER — ATORVASTATIN CALCIUM 40 MG PO TABS: 40.0000 mg | ORAL_TABLET | Freq: Every day | ORAL | Status: DC

## 2014-03-08 NOTE — Progress Notes (Signed)
Subjective:    Patient ID: Yvette Ferguson, female    DOB: [birth year], 58 y.o.   MRN: [id number]  HPI patient in for followup of blood pressure. She also is in to discuss surgery use of a statin drug. She has been to Dr. Jacinto Halim but did not have testing done for financial reasons. She states her depression is doing well. She rarely takes her alprazolam for sleep at night. She currently is living by herself. She spends a lot of time caring for her grandchild.    Review of Systems     Objective:   Physical Exam  Constitutional: She appears well-developed and well-nourished.  Eyes: Pupils are equal, round, and reactive to light.  Neck: No thyromegaly present.  Cardiovascular: Normal rate, regular rhythm and normal heart sounds.   Pulmonary/Chest: No respiratory distress. She has no wheezes.  Abdominal: Soft. She exhibits no distension. There is no tenderness.          Assessment & Plan:  Patient stable at present. Her medications were refilled. She is willing to try Lipitor 40 mg half tablet a day. I told her she could decrease her Valtrex to 500 mg daily for cold sores suppression

## 2014-03-15 IMAGING — CT CT ABD-PELV W/ CM
3 of 5 series · 13 of 36 positions shown, 19 images · IV contrast (READICAT/WATER & [ID] OMNI 300)
Comparison: Abdominal ultrasound 12/25/2011.

CLINICAL DATA: Upper abdominal pain.  Mid upper abdominal pain.
Family history pancreatic cancer.

CT ABDOMEN AND PELVIS WITH CONTRAST
TECHNIQUE: Multidetector CT imaging of the abdomen and pelvis was
performed following the standard protocol during bolus
administration of intravenous contrast.
Contrast: 100mL OMNIPAQUE IOHEXOL 300 MG/ML  SOLN

[Series 3: abd/pelvis with · axial · 0.70mm/px · z∈[-365,-45]mm · 8 of 84 slices shown, 13 images]
[im 10/84  soft-tissue]
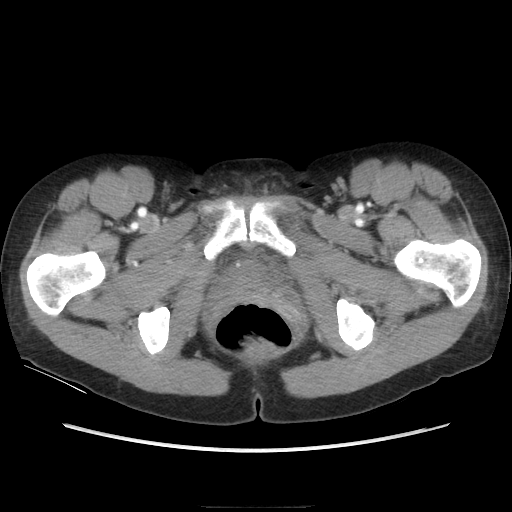
[im 10/84  bone]
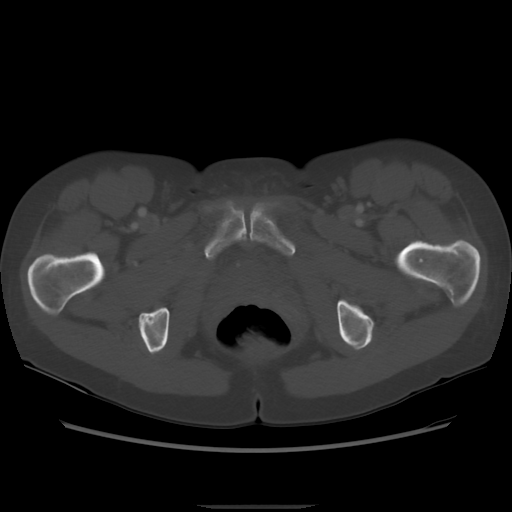
[im 19/84  soft-tissue]
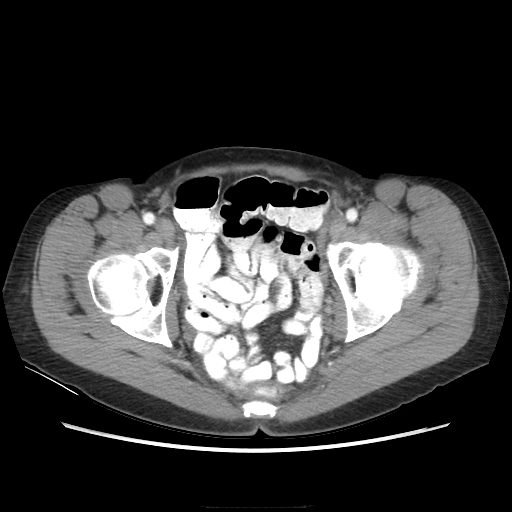
[im 28/84  soft-tissue]
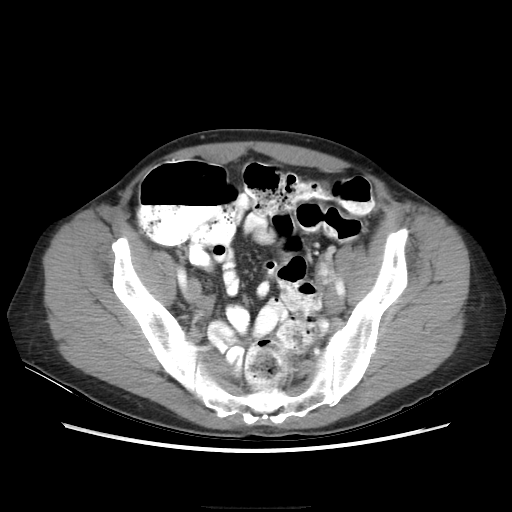
[im 37/84  soft-tissue]
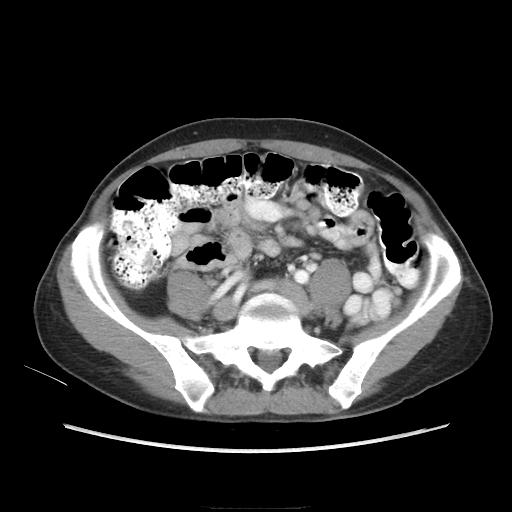
[im 47/84  soft-tissue]
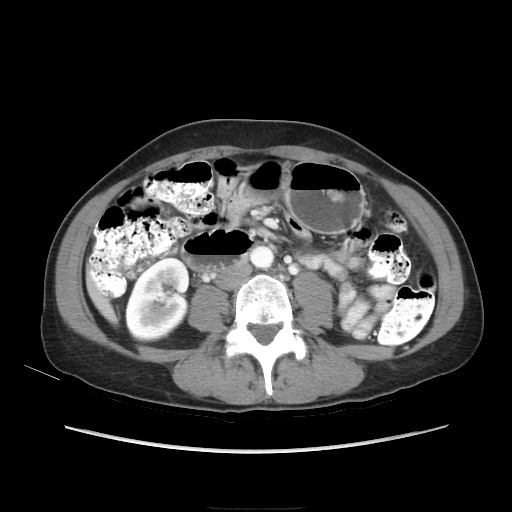
[im 47/84  lung]
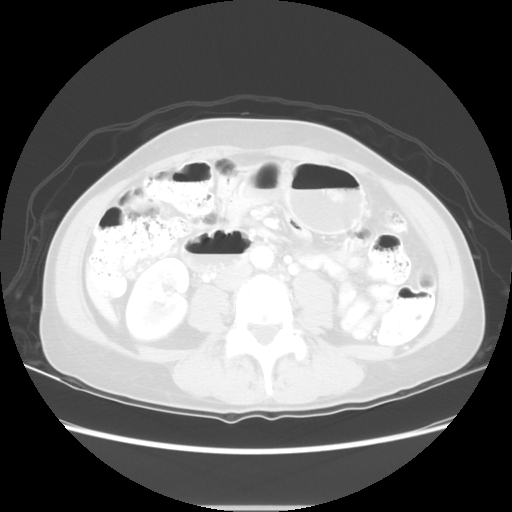
[im 56/84  soft-tissue]
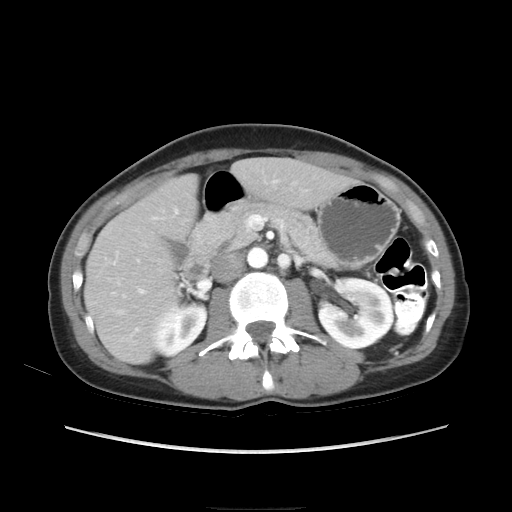
[im 56/84  lung]
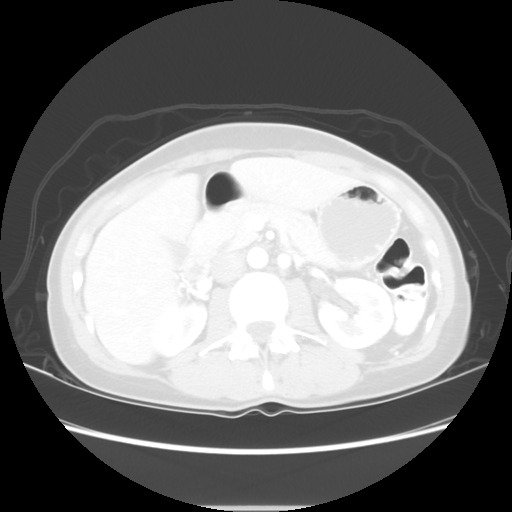
[im 65/84  soft-tissue]
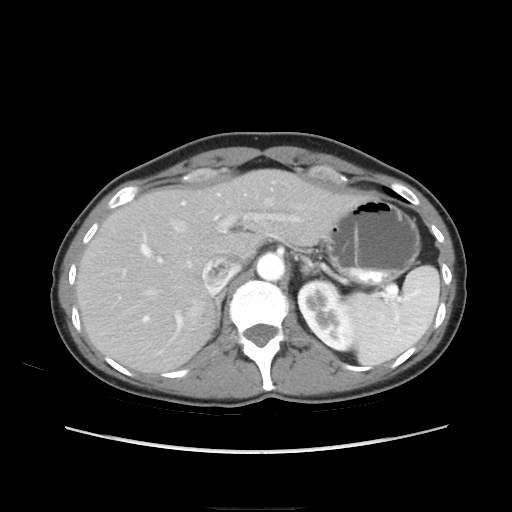
[im 65/84  lung]
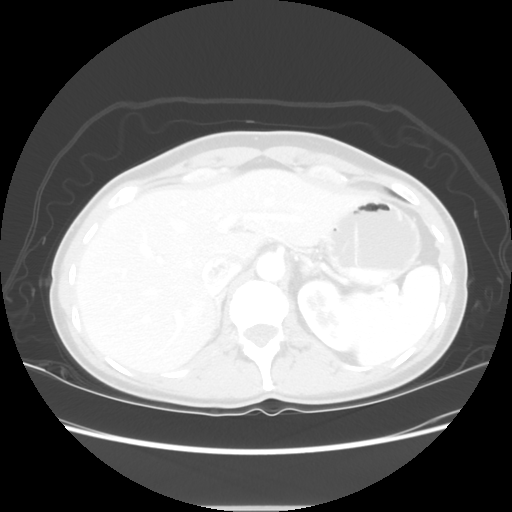
[im 74/84  soft-tissue]
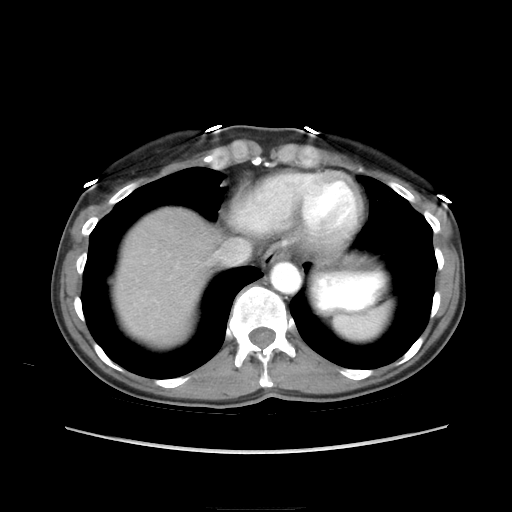
[im 74/84  lung]
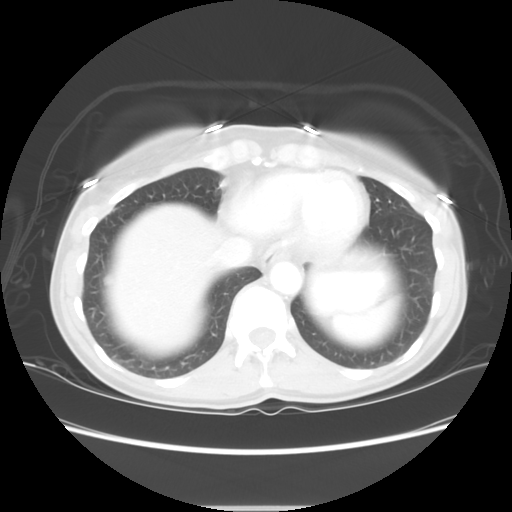

[Series 601: coronal body · coronal · 0.86mm/px · 1 of 106 slices shown, 2 images]
[im 36/106  soft-tissue]
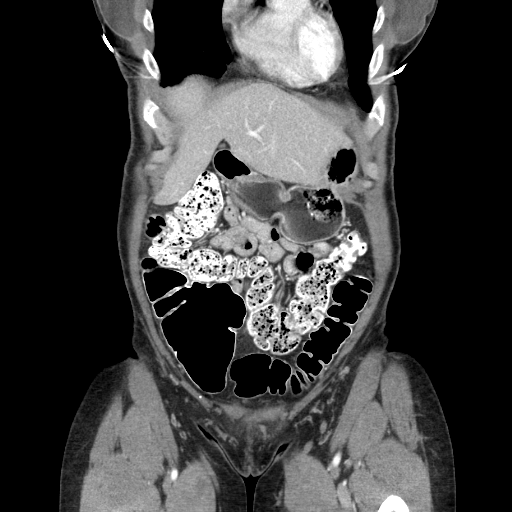
[im 36/106  bone]
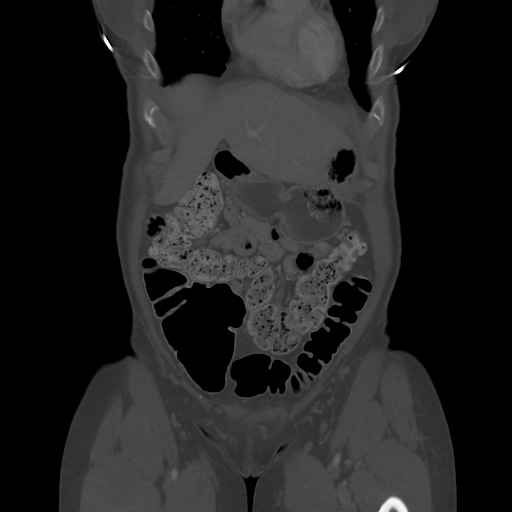

[Series 602: sagittal body · sagittal · 0.86mm/px · 4 of 145 slices shown]
[im 10/145  soft-tissue]
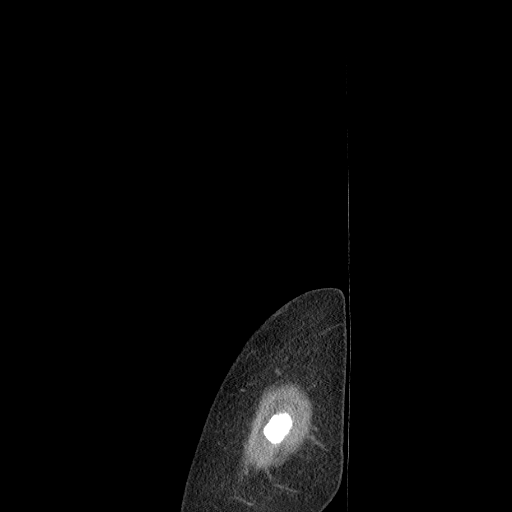
[im 29/145  soft-tissue]
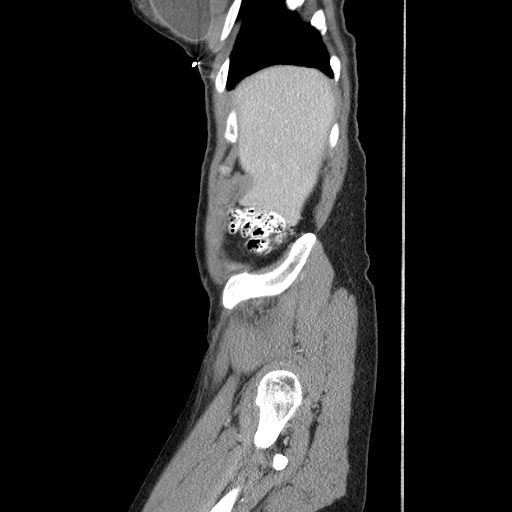
[im 49/145  soft-tissue]
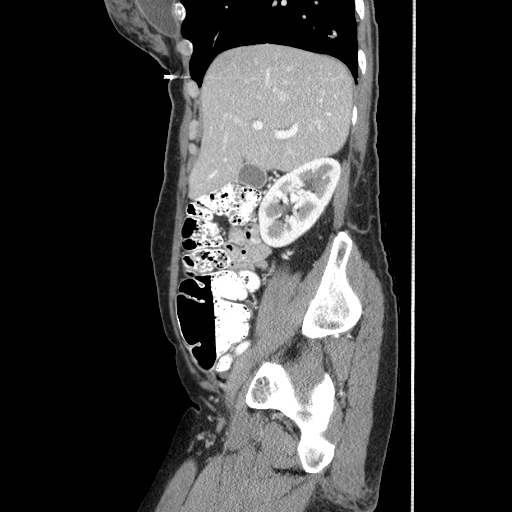
[im 68/145  soft-tissue]
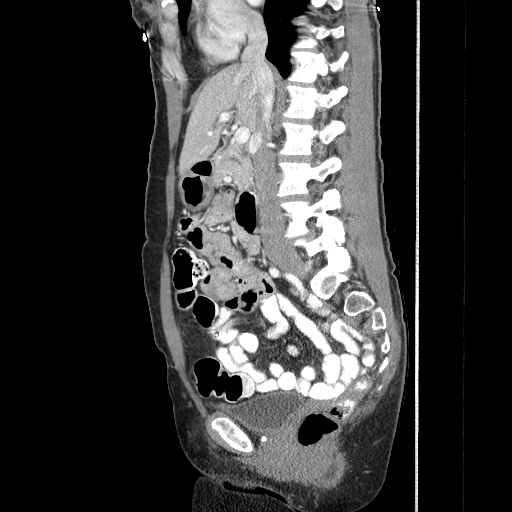

[13 of 36 positions shown; findings below may reference images not displayed]

FINDINGS: Lung Bases: Mild dependent atelectasis.  Bilateral breast
implants noted.  The heart appears within normal limits.

Liver:  Left hepatic lobe appears normal.  There is a 5 mm low
density lesion in the right hepatic lobe (image number 14 series 3)
which is too small to characterize.  This is not seen on the
delayed images.  Although nonspecific, this probably represents a
small hemangioma or cyst.  Otherwise the liver appears normal.

Spleen:  Normal.

Gallbladder:  Normal.

Common bile duct:  Normal.

Pancreas:  Normal.

Adrenal glands:  Normal bilaterally.

Kidneys:  Normal enhancement.  Normal delayed excretion of
contrast.  The ureters appear within normal limits.

Stomach:  Normal.

Small bowel:  Mild gaseous distention of the duodenum.  Small bowel
appears within normal limits.  No mesenteric adenopathy.  No
inflammatory changes or free air.

Colon:   Contrast fills the cecum.  Appendix not identified.  No
right lower quadrant inflammatory changes.  The colon appears
within normal limits.  Rectum appears normal.

Pelvic Genitourinary:  No free fluid.  Hysterectomy.  Urinary
bladder normal.  The ovaries appear within normal limits.

Bones:  No aggressive osseous lesions.  Lower lumbar facet
arthrosis.  Moderate to severe left hip osteoarthritis and mild
right hip osteoarthritis.

Vasculature: Normal.

Body Wall: Tiny fat containing periumbilical hernia.
IMPRESSION: Negative for pancreatic mass.  No acute abnormality.  Hysterectomy.

## 2014-07-29 ENCOUNTER — Other Ambulatory Visit: Payer: Self-pay | Admitting: Emergency Medicine

## 2014-08-08 ENCOUNTER — Ambulatory Visit (INDEPENDENT_AMBULATORY_CARE_PROVIDER_SITE_OTHER): Payer: BLUE CROSS/BLUE SHIELD

## 2014-08-08 ENCOUNTER — Ambulatory Visit (INDEPENDENT_AMBULATORY_CARE_PROVIDER_SITE_OTHER): Payer: BLUE CROSS/BLUE SHIELD | Admitting: Emergency Medicine

## 2014-08-08 VITALS — BP 100/68 | HR 73 | Temp 98.0°F | Resp 16 | Ht 64.5 in | Wt 131.0 lb

## 2014-08-08 DIAGNOSIS — R05 Cough: Secondary | ICD-10-CM | POA: Diagnosis not present

## 2014-08-08 DIAGNOSIS — R059 Cough, unspecified: Secondary | ICD-10-CM

## 2014-08-08 LAB — POCT CBC
Granulocyte percent: 76.9 %G (ref 37–80)
HCT, POC: 39.8 % (ref 37.7–47.9)
Hemoglobin: 13.2 g/dL (ref 12.2–16.2)
Lymph, poc: 1.1 (ref 0.6–3.4)
MCH, POC: 33.2 pg — AB (ref 27–31.2)
MCHC: 33.3 g/dL (ref 31.8–35.4)
MCV: 99.9 fL — AB (ref 80–97)
MID (cbc): 0.4 (ref 0–0.9)
MPV: 7.6 fL (ref 0–99.8)
POC Granulocyte: 5.2 (ref 2–6.9)
POC LYMPH PERCENT: 16.6 %L (ref 10–50)
POC MID %: 6.5 %M (ref 0–12)
Platelet Count, POC: 167 10*3/uL (ref 142–424)
RBC: 3.98 M/uL — AB (ref 4.04–5.48)
RDW, POC: 14.5 %
WBC: 6.7 10*3/uL (ref 4.6–10.2)

## 2014-08-08 MED ORDER — HYDROCODONE-HOMATROPINE 5-1.5 MG/5ML PO SYRP: 5.0000 mL | ORAL_SOLUTION | Freq: Three times a day (TID) | ORAL | Status: DC | PRN

## 2014-08-08 MED ORDER — BENZONATATE 100 MG PO CAPS: 100.0000 mg | ORAL_CAPSULE | Freq: Three times a day (TID) | ORAL | Status: DC | PRN

## 2014-08-08 NOTE — Progress Notes (Signed)
Subjective:  This chart was scribed for [name], MD by Charline Bills, ED Scribe. The patient was seen in room 9. Patient's care was started at 10:43 AM.    Patient ID: Yvette Ferguson, female    DOB: September 04, 1955, 59 y.o.   MRN: [id number]  Chief Complaint  Patient presents with  . Nasal Congestion    onset 3 days  . Cough  . Diarrhea    onset this AM   HPI HPI Comments: Yvette Ferguson is a 59 y.o. female, with a h/o hyperlipidemia, herpes labialis, tremor, depression, who presents to the [facility] complaining of persistent dry cough for the past 3 days. Pt reports minimal sputum with cough at night. She reports associated scratchy throat, ear stuffiness, eye pain that she describes as a burning sensation, nasal congestion for the past 3 days, diarrhea onset this morning. Pt denies wheezing, fever, sore throat, abdominal pain. She reports that her grandson, who she keeps x3 weekly, had similar URI symptoms last week. No h/o bronchitis. She has been treating with cough suppressant without relief. Pt is a nonsmoker.  Past Medical History  Diagnosis Date  . Depression   . Herpes labialis   . Tremor   . Hyperlipidemia    Current Outpatient Prescriptions on File Prior to Visit  Medication Sig Dispense Refill  . ALPRAZolam (XANAX) 0.5 MG tablet Take 1 tablet (0.5 mg total) by mouth at bedtime as needed for anxiety. 30 tablet 2  . atorvastatin (LIPITOR) 40 MG tablet Take 1 tablet (40 mg total) by mouth daily. 90 tablet 3  . buPROPion (WELLBUTRIN XL) 300 MG 24 hr tablet TAKE 1 TABLET (300 MG TOTAL) BY MOUTH EVERY MORNING.  "OV NEEDED FOR ADDITIONAL REFILLS" 30 tablet 0  . Multiple Vitamin (MULTIVITAMIN) tablet Take 1 tablet by mouth daily.    . valACYclovir (VALTREX) 1000 MG tablet TAKE 1 TABLET BY MOUTH EVERY DAY 90 tablet 1   No current facility-administered medications on file prior to visit.   No Known Allergies  Review of Systems  Constitutional: Negative  for fever.  HENT: Positive for congestion. Negative for sore throat.   Eyes: Positive for pain (burning).  Respiratory: Positive for cough. Negative for wheezing.   Gastrointestinal: Positive for diarrhea. Negative for abdominal pain.   BP 100/68 mmHg  Pulse 73  Temp(Src) 98 F (36.7 C) (Oral)  Resp 16  Ht 5' 4.5" (1.638 m)  Wt 131 lb (59.421 kg)  BMI 22.15 kg/m2  SpO2 97%    Objective:   Physical Exam CONSTITUTIONAL: Well developed/well nourished HEAD: Normocephalic/atraumatic EYES: EOMI/PERRL ENMT: Mucous membranes moist, significant nasal congestion  NECK: supple no meningeal signs SPINE/BACK:entire spine nontender CV: S1/S2 noted, no murmurs/rubs/gallops noted LUNGS: Diffuse rhonchi, no rales, breath sounds are symmetrical  ABDOMEN: soft, nontender, no rebound or guarding, bowel sounds noted throughout abdomen GU:no cva tenderness NEURO: Pt is awake/alert/appropriate, moves all extremitiesx4.  No facial droop.   EXTREMITIES: pulses normal/equal, full ROM SKIN: warm, color normal PSYCH: no abnormalities of mood noted, alert and oriented to situation   [facility] reading (PRIMARY) by  Dr. Cleta Alberts lung fields appear clear there are no consolidated infiltrates. Heart size normal. Results for orders placed or performed in visit on 08/08/14  POCT CBC  Result Value Ref Range   WBC 6.7 4.6 - 10.2 K/uL   Lymph, poc 1.1 0.6 - 3.4   POC LYMPH PERCENT 16.6 10 - 50 %L   MID (cbc) 0.4  0 - 0.9   POC MID % 6.5 0 - 12 %M   POC Granulocyte 5.2 2 - 6.9   Granulocyte percent 76.9 37 - 80 %G   RBC 3.98 (A) 4.04 - 5.48 M/uL   Hemoglobin 13.2 12.2 - 16.2 g/dL   HCT, POC 40.9 81.1 - 47.9 %   MCV 99.9 (A) 80 - 97 fL   MCH, POC 33.2 (A) 27 - 31.2 pg   MCHC 33.3 31.8 - 35.4 g/dL   RDW, POC 91.4 %   Platelet Count, POC 167 142 - 424 K/uL   MPV 7.6 0 - 99.8 fL   Assessment & Plan:   Will treat with Tessalon Pearls and Hycodan for cough. She will call if she develops a productive cough. I  personally performed the services described in this documentation, which was scribed in my presence. The recorded information has been reviewed and is accurate.

## 2014-08-08 NOTE — Patient Instructions (Signed)
Cough, Adult  A cough is a reflex that helps clear your throat and airways. It can help heal the body or may be a reaction to an irritated airway. A cough may only last 2 or 3 weeks (acute) or may last more than 8 weeks (chronic).  CAUSES Acute cough:  Viral or bacterial infections. Chronic cough:  Infections.  Allergies.  Asthma.  Post-nasal drip.  Smoking.  Heartburn or acid reflux.  Some medicines.  Chronic lung problems (COPD).  Cancer. SYMPTOMS   Cough.  Fever.  Chest pain.  Increased breathing rate.  High-pitched whistling sound when breathing (wheezing).  Colored mucus that you cough up (sputum). TREATMENT   A bacterial cough may be treated with antibiotic medicine.  A viral cough must run its course and will not respond to antibiotics.  Your caregiver may recommend other treatments if you have a chronic cough. HOME CARE INSTRUCTIONS   Only take over-the-counter or prescription medicines for pain, discomfort, or fever as directed by your caregiver. Use cough suppressants only as directed by your caregiver.  Use a cold steam vaporizer or humidifier in your bedroom or home to help loosen secretions.  Sleep in a semi-upright position if your cough is worse at night.  Rest as needed.  Stop smoking if you smoke. SEEK IMMEDIATE MEDICAL CARE IF:   You have pus in your sputum.  Your cough starts to worsen.  You cannot control your cough with suppressants and are losing sleep.  You begin coughing up blood.  You have difficulty breathing.  You develop pain which is getting worse or is uncontrolled with medicine.  You have a fever. MAKE SURE YOU:   Understand these instructions.  Will watch your condition.  Will get help right away if you are not doing well or get worse. Document Released: 11/23/2010 Document Revised: 08/19/2011 Document Reviewed: 11/23/2010 ExitCare Patient Information 2015 [employer]. This information is not intended  to replace advice given to you by your health care provider. Make sure you discuss any questions you have with your health care provider.  

## 2014-08-29 ENCOUNTER — Other Ambulatory Visit: Payer: Self-pay | Admitting: Physician Assistant

## 2014-11-15 ENCOUNTER — Ambulatory Visit (INDEPENDENT_AMBULATORY_CARE_PROVIDER_SITE_OTHER): Payer: BLUE CROSS/BLUE SHIELD | Admitting: Emergency Medicine

## 2014-11-15 ENCOUNTER — Encounter: Payer: Self-pay | Admitting: Emergency Medicine

## 2014-11-15 VITALS — BP 132/74 | HR 72 | Temp 98.0°F | Resp 16 | Ht 64.5 in | Wt 126.4 lb

## 2014-11-15 DIAGNOSIS — F329 Major depressive disorder, single episode, unspecified: Secondary | ICD-10-CM | POA: Diagnosis not present

## 2014-11-15 DIAGNOSIS — B009 Herpesviral infection, unspecified: Secondary | ICD-10-CM

## 2014-11-15 DIAGNOSIS — E785 Hyperlipidemia, unspecified: Secondary | ICD-10-CM | POA: Diagnosis not present

## 2014-11-15 DIAGNOSIS — F32A Depression, unspecified: Secondary | ICD-10-CM

## 2014-11-15 MED ORDER — BUPROPION HCL ER (XL) 300 MG PO TB24: ORAL_TABLET | ORAL | Status: DC

## 2014-11-15 MED ORDER — VALACYCLOVIR HCL 1 G PO TABS: ORAL_TABLET | ORAL | Status: DC

## 2014-11-15 NOTE — Progress Notes (Signed)
Subjective:  This chart was scribed for Yvette Gobble, MD by Veverly Fells, at [facility].  This patient was seen in room 28 and the patient's care was started at 2:20 PM.    Patient ID: Yvette Ferguson, female    DOB: [birth year], 59 y.o.   MRN: [id number] Chief Complaint  Patient presents with  . Medication Refill    HPI  HPI Comments: [name] is a 59 y.o. female who presents to the [facility] for medication refill.  Patient notes she has been out of valtrex (1- 1/2 per day)  for about 2 weeks.  She is also requesting a refill for her Wellbutrin.  Patient is on melatonin and no longer takes xanax. Her obgyn put on .5 mg estrogen cream for her itching and thinks that the cream may have helped her.  She is not taking any cholesterol medication.   She has no other complaints or concerns today.    Past Medical History  Diagnosis Date  . Depression   . Herpes labialis   . Tremor   . Hyperlipidemia     Current Outpatient Prescriptions on File Prior to Visit  Medication Sig Dispense Refill  . buPROPion (WELLBUTRIN XL) 300 MG 24 hr tablet TAKE 1 TABLET (300 MG TOTAL) BY MOUTH EVERY MORNING.  "OV NEEDED FOR ADDITIONAL REFILLS" 2ND 30 tablet 0  . Multiple Vitamin (MULTIVITAMIN) tablet Take 1 tablet by mouth daily.    . valACYclovir (VALTREX) 1000 MG tablet TAKE 1 TABLET BY MOUTH EVERY DAY 90 tablet 1  . ALPRAZolam (XANAX) 0.5 MG tablet Take 1 tablet (0.5 mg total) by mouth at bedtime as needed for anxiety. (Patient not taking: Reported on 11/15/2014) 30 tablet 2  . atorvastatin (LIPITOR) 40 MG tablet Take 1 tablet (40 mg total) by mouth daily. (Patient not taking: Reported on 11/15/2014) 90 tablet 3   No current facility-administered medications on file prior to visit.    No Known Allergies  No results found for this or any previous visit (from the past 2160 hour(s)).     Review of Systems     Objective:   Physical Exam    CONSTITUTIONAL: Well developed/well nourished HEAD: Normocephalic/atraumatic EYES: EOMI/PERRL ENMT: Mucous membranes moist NECK: supple no meningeal signs SPINE/BACK:entire spine nontender CV: S1/S2 noted, no murmurs/rubs/gallops noted LUNGS: Lungs are clear to auscultation bilaterally, no apparent distress ABDOMEN: soft, nontender, no rebound or guarding, bowel sounds noted throughout abdomen GU:no cva tenderness NEURO: Pt is awake/alert/appropriate, moves all extremitiesx4.  No facial droop.   EXTREMITIES: pulses normal/equal, full ROM SKIN: warm, color normal PSYCH: no abnormalities of mood noted, alert and oriented to situation  Filed Vitals:   11/15/14 1417  BP: 132/74  Pulse: 72  Temp: 98 F (36.7 C)  TempSrc: Oral  Resp: 16  Height: 5' 4.5" (1.638 m)  Weight: 126 lb 6.4 oz (57.335 kg)  SpO2: 97%       Assessment & Plan:  Patient is doing well. Her Wellbutrin was refilled and her Valtrex was refilled. Recheck when necessary she does see the gynecologist regularly.I personally performed the services described in this documentation, which was scribed in my presence. The recorded information has been reviewed and is accurate.  Earl Lites, MD

## 2014-11-16 ENCOUNTER — Encounter: Payer: Self-pay | Admitting: *Deleted

## 2015-03-14 ENCOUNTER — Encounter: Payer: Self-pay | Admitting: Emergency Medicine

## 2015-07-13 ENCOUNTER — Ambulatory Visit (INDEPENDENT_AMBULATORY_CARE_PROVIDER_SITE_OTHER): Payer: BLUE CROSS/BLUE SHIELD | Admitting: Emergency Medicine

## 2015-07-13 ENCOUNTER — Encounter: Payer: Self-pay | Admitting: Emergency Medicine

## 2015-07-13 VITALS — BP 112/74 | HR 81 | Temp 98.0°F | Resp 18 | Wt 126.8 lb

## 2015-07-13 DIAGNOSIS — Z800 Family history of malignant neoplasm of digestive organs: Secondary | ICD-10-CM

## 2015-07-13 DIAGNOSIS — E785 Hyperlipidemia, unspecified: Secondary | ICD-10-CM | POA: Diagnosis not present

## 2015-07-13 DIAGNOSIS — F32A Depression, unspecified: Secondary | ICD-10-CM

## 2015-07-13 DIAGNOSIS — F329 Major depressive disorder, single episode, unspecified: Secondary | ICD-10-CM | POA: Diagnosis not present

## 2015-07-13 DIAGNOSIS — B009 Herpesviral infection, unspecified: Secondary | ICD-10-CM | POA: Diagnosis not present

## 2015-07-13 LAB — COMPLETE METABOLIC PANEL WITH GFR
ALT: 27 U/L (ref 6–29)
AST: 26 U/L (ref 10–35)
Albumin: 4.3 g/dL (ref 3.6–5.1)
Alkaline Phosphatase: 46 U/L (ref 33–130)
BUN: 18 mg/dL (ref 7–25)
CO2: 28 mmol/L (ref 20–31)
Calcium: 9.4 mg/dL (ref 8.6–10.4)
Chloride: 104 mmol/L (ref 98–110)
Creat: 0.89 mg/dL (ref 0.50–1.05)
GFR, Est African American: 83 mL/min (ref >=60)
GFR, Est Non African American: 72 mL/min (ref >=60)
Glucose, Bld: 125 mg/dL — ABNORMAL HIGH (ref 65–99)
Potassium: 3.7 mmol/L (ref 3.5–5.3)
Sodium: 140 mmol/L (ref 135–146)
Total Bilirubin: 0.6 mg/dL (ref 0.2–1.2)
Total Protein: 6.6 g/dL (ref 6.1–8.1)

## 2015-07-13 LAB — CBC WITH DIFFERENTIAL/PLATELET
Basophils Absolute: 0.1 10*3/uL (ref 0.0–0.1)
Basophils Relative: 1 % (ref 0–1)
Eosinophils Absolute: 0.1 10*3/uL (ref 0.0–0.7)
Eosinophils Relative: 1 % (ref 0–5)
HCT: 40 % (ref 36.0–46.0)
Hemoglobin: 13.6 g/dL (ref 12.0–15.0)
Lymphocytes Relative: 25 % (ref 12–46)
Lymphs Abs: 2.2 10*3/uL (ref 0.7–4.0)
MCH: 32.4 pg (ref 26.0–34.0)
MCHC: 34 g/dL (ref 30.0–36.0)
MCV: 95.2 fL (ref 78.0–100.0)
MPV: 10.1 fL (ref 8.6–12.4)
Monocytes Absolute: 0.5 10*3/uL (ref 0.1–1.0)
Monocytes Relative: 6 % (ref 3–12)
Neutro Abs: 6 10*3/uL (ref 1.7–7.7)
Neutrophils Relative %: 67 % (ref 43–77)
Platelets: 249 10*3/uL (ref 150–400)
RBC: 4.2 MIL/uL (ref 3.87–5.11)
RDW: 13.1 % (ref 11.5–15.5)
WBC: 8.9 10*3/uL (ref 4.0–10.5)

## 2015-07-13 LAB — LIPASE: Lipase: 26 U/L (ref 7–60)

## 2015-07-13 LAB — TSH: TSH: 2.204 u[IU]/mL (ref 0.350–4.500)

## 2015-07-13 MED ORDER — ALPRAZOLAM 0.5 MG PO TABS: 0.5000 mg | ORAL_TABLET | Freq: Every evening | ORAL | Status: DC | PRN

## 2015-07-13 MED ORDER — BUPROPION HCL ER (XL) 300 MG PO TB24: ORAL_TABLET | ORAL | Status: DC

## 2015-07-13 MED ORDER — VALACYCLOVIR HCL 1 G PO TABS: ORAL_TABLET | ORAL | Status: DC

## 2015-07-13 NOTE — Patient Instructions (Signed)
We recommend that you schedule a mammogram for breast cancer screening. Typically, you do not need a referral to do this. Please contact a local imaging center to schedule your mammogram.  [facility] - [phone]  *ask for the Radiology Department [facility]) - [phone] or [phone]  [facility] - [phone] [facility] - [phone] [facility] - [phone]  *ask for the Radiology Department [facility] - [phone]  *ask for the Radiology Department [facility] - [phone]  *ask for the Mammography Department [facility] - [phone] 

## 2015-07-13 NOTE — Progress Notes (Signed)
By signing my name below, I, Stann Ore, attest that this documentation has been prepared under the direction and in the presence of Lesle Chris, MD. Electronically Signed: Stann Ore, Scribe. 07/13/2015 , 4:27 PM .  Patient was seen in room 22 .  Chief Complaint:  Chief Complaint  Patient presents with  . Medication Refill    All medications     HPI: Yvette Ferguson is a 60 y.o. female who reports to [facility] today for medication refills.   Family Her family is doing well. Her daughter is in [place].   Her mother passed at 94 years old with pancreatic cancer.  Her sister passed at 48 years old with pancreatic cancer.   Exercise She's walking again recently.   Medications She hasn't taken her xanax recently. She would usually take half tablet when she can't sleep due to irritation caused by menopause. She would also drink calming tea to help with the sleep.   She still takes half tablet of valtrex a day for prevention.   She's stopped taking lipitor after discussion with Dr. Jacinto Halim (her cardiologist).   GYN  She will see her GYN (Dr. Marcelle Overlie) in March or April. She will have her mammogram done then.   Past Medical History  Diagnosis Date  . Depression   . Herpes labialis   . Tremor   . Hyperlipidemia    Past Surgical History  Procedure Laterality Date  . Abdominal hysterectomy  2001  . Eyelid surgery    . Breast surgery  2000   Social History   Social History  . Marital Status: Single    Spouse Name: N/A  . Number of Children: N/A  . Years of Education: N/A   Social History Main Topics  . Smoking status: Never Smoker   . Smokeless tobacco: None  . Alcohol Use: Yes     Comment: occassionally  . Drug Use: No  . Sexual Activity: Yes   Other Topics Concern  . None   Social History Narrative   Family History  Problem Relation Age of Onset  . Diabetes Mother   . Pancreatic cancer Mother   . Hypertension Mother   . Diabetes Father   .  Leukemia Father   . Hypertension Father    No Known Allergies Prior to Admission medications   Medication Sig Start Date End Date Taking? Authorizing Provider  ALPRAZolam Prudy Feeler) 0.5 MG tablet Take 1 tablet (0.5 mg total) by mouth at bedtime as needed for anxiety. Patient not taking: Reported on 11/15/2014 03/08/14   [name], MD  atorvastatin (LIPITOR) 40 MG tablet Take 1 tablet (40 mg total) by mouth daily. Patient not taking: Reported on 11/15/2014 03/08/14   [name], MD  buPROPion (WELLBUTRIN XL) 300 MG 24 hr tablet TAKE 1 TABLET (300 MG TOTAL) BY MOUTH EVERY MORNING. 11/15/14   [name], MD  Multiple Vitamin (MULTIVITAMIN) tablet Take 1 tablet by mouth daily.    Historical Provider, MD  valACYclovir (VALTREX) 1000 MG tablet TAKE 1 TABLET BY MOUTH EVERY DAY 11/15/14   [name], MD     ROS:  Constitutional: negative for fever, chills, night sweats, weight changes, or fatigue  HEENT: negative for vision changes, hearing loss, congestion, rhinorrhea, ST, epistaxis, or sinus pressure Cardiovascular: negative for chest pain or palpitations Respiratory: negative for hemoptysis, wheezing, shortness of breath, or cough Abdominal: negative for abdominal pain, nausea, vomiting, diarrhea, or constipation Dermatological: negative for rash Neurologic: negative for  headache, dizziness, or syncope All other systems reviewed and are otherwise negative with the exception to those above and in the HPI.  PHYSICAL EXAM: Filed Vitals:   07/13/15 1555  BP: 112/74  Pulse: 81  Temp: 98 F (36.7 C)  Resp: 18   Body mass index is 21.44 kg/(m^2).   General: Alert, no acute distress HEENT:  Normocephalic, atraumatic, oropharynx patent. Eye: Nonie Hoyer [facility] Cardiovascular:  Regular rate and rhythm, no rubs murmurs or gallops.  No Carotid bruits, radial pulse intact. No pedal edema.  Respiratory: Clear to auscultation bilaterally.  No wheezes, rales, or rhonchi.  No cyanosis, no use of  accessory musculature Abdominal: No organomegaly, abdomen is soft and non-tender, positive bowel sounds. No masses. Musculoskeletal: Gait intact. No edema, tenderness Skin: No rashes. Neurologic: Facial musculature symmetric. Psychiatric: Patient acts appropriately throughout our interaction.  Lymphatic: No cervical or submandibular lymphadenopathy Genitourinary/Anorectal: No acute findings   LABS:   EKG/XRAY:   Primary read interpreted by Dr. Cleta Alberts at [facility].   ASSESSMENT/PLAN: Patient will not take statin. She is doing okay on her antidepressants. She is currently single. 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 No changes were made in her medications. I did check a pancreatic cancer 19-9 marker because her mother and her sister both had pancreatic cancer.I personally performed the services described in this documentation, which was scribed in my presence. The recorded information has been reviewed and is accurate.   Gross sideeffects, risk and benefits, and alternatives of medications d/w patient. Patient is aware that all medications have potential sideeffects and we are unable to predict every sideeffect or drug-drug interaction that may occur.  Lesle Chris MD 07/13/2015 4:27 PM

## 2015-07-14 LAB — CANCER ANTIGEN 19-9: CA 19-9: 6.7 U/mL (ref <35.0)

## 2015-07-18 ENCOUNTER — Ambulatory Visit: Payer: BLUE CROSS/BLUE SHIELD | Admitting: Emergency Medicine

## 2015-07-26 ENCOUNTER — Other Ambulatory Visit: Payer: Self-pay | Admitting: *Deleted

## 2015-07-26 ENCOUNTER — Other Ambulatory Visit (INDEPENDENT_AMBULATORY_CARE_PROVIDER_SITE_OTHER): Payer: BLUE CROSS/BLUE SHIELD

## 2015-07-26 DIAGNOSIS — R739 Hyperglycemia, unspecified: Secondary | ICD-10-CM

## 2015-07-26 LAB — POCT GLYCOSYLATED HEMOGLOBIN (HGB A1C): Hemoglobin A1C: 4.8

## 2015-10-30 DIAGNOSIS — Z6821 Body mass index (BMI) 21.0-21.9, adult: Secondary | ICD-10-CM | POA: Diagnosis not present

## 2015-10-30 DIAGNOSIS — Z01419 Encounter for gynecological examination (general) (routine) without abnormal findings: Secondary | ICD-10-CM | POA: Diagnosis not present

## 2015-10-30 DIAGNOSIS — Z1231 Encounter for screening mammogram for malignant neoplasm of breast: Secondary | ICD-10-CM | POA: Diagnosis not present

## 2015-10-30 DIAGNOSIS — Z13820 Encounter for screening for osteoporosis: Secondary | ICD-10-CM | POA: Diagnosis not present

## 2015-11-08 DIAGNOSIS — L820 Inflamed seborrheic keratosis: Secondary | ICD-10-CM | POA: Diagnosis not present

## 2016-03-14 ENCOUNTER — Ambulatory Visit (INDEPENDENT_AMBULATORY_CARE_PROVIDER_SITE_OTHER): Payer: BLUE CROSS/BLUE SHIELD | Admitting: Physician Assistant

## 2016-03-14 VITALS — BP 124/72 | HR 81 | Temp 98.4°F | Resp 16 | Ht 64.5 in | Wt 130.0 lb

## 2016-03-14 DIAGNOSIS — E785 Hyperlipidemia, unspecified: Secondary | ICD-10-CM | POA: Diagnosis not present

## 2016-03-14 DIAGNOSIS — B001 Herpesviral vesicular dermatitis: Secondary | ICD-10-CM | POA: Diagnosis not present

## 2016-03-14 DIAGNOSIS — F32A Depression, unspecified: Secondary | ICD-10-CM

## 2016-03-14 DIAGNOSIS — F329 Major depressive disorder, single episode, unspecified: Secondary | ICD-10-CM

## 2016-03-14 MED ORDER — VALACYCLOVIR HCL 1 G PO TABS: ORAL_TABLET | ORAL | 3 refills | Status: DC

## 2016-03-14 MED ORDER — ALPRAZOLAM 0.5 MG PO TABS: 0.5000 mg | ORAL_TABLET | Freq: Every evening | ORAL | 0 refills | Status: DC | PRN

## 2016-03-14 MED ORDER — BUPROPION HCL ER (XL) 300 MG PO TB24: ORAL_TABLET | ORAL | 3 refills | Status: DC

## 2016-03-14 NOTE — Progress Notes (Signed)
Patient ID: Yvette Ferguson, female    DOB: [birth year], 60 y.o.   MRN: [id number]  PCP: Lucilla Edin, MD  Subjective:   Chief Complaint  Patient presents with  . Medication Refill    xanax, wellbutrin, valtrex    HPI Presents for medication refills. She had hoped to see Dr. Cleta Alberts again before he retired, but didn't make it in to the office by then.  Feels well, that her medications are doing what they should, without adverse effects..  Declines the outstanding health maintenance items: sees Dr. Marcelle Overlie for cervical cancer and breast cancer screening, isn't sure her insurance will cover HIV and Hep C screening, isn't sure that she wants to get the vaccines.    Review of Systems  Constitutional: Negative.   HENT: Negative for sore throat.   Eyes: Negative for visual disturbance.  Respiratory: Negative for cough, chest tightness, shortness of breath and wheezing.   Cardiovascular: Negative for chest pain and palpitations.  Gastrointestinal: Negative for abdominal pain, diarrhea, nausea and vomiting.  Genitourinary: Negative for dysuria, frequency, hematuria and urgency.  Musculoskeletal: Negative for arthralgias and myalgias.  Skin: Negative for rash.  Neurological: Negative for dizziness, weakness and headaches.  Psychiatric/Behavioral: Negative for decreased concentration. The patient is not nervous/anxious.        Patient Active Problem List   Diagnosis Date Noted  . Menopause 05/13/2012  . Dyspepsia 05/13/2012  . Depression   . Herpes labialis   . Tremor   . Hyperlipidemia      Prior to Admission medications   Medication Sig Start Date End Date Taking? Authorizing Provider  ALPRAZolam Prudy Feeler) 0.5 MG tablet Take 1 tablet (0.5 mg total) by mouth at bedtime as needed for anxiety. 07/13/15  Yes [name], MD  aspirin EC 81 MG tablet Take 81 mg by mouth daily.   Yes Historical Provider, MD  buPROPion (WELLBUTRIN XL) 300 MG 24 hr tablet TAKE 1 TABLET (300 MG  TOTAL) BY MOUTH EVERY MORNING. 07/13/15  Yes [name], MD  Multiple Vitamin (MULTIVITAMIN) tablet Take 1 tablet by mouth daily.   Yes Historical Provider, MD  valACYclovir (VALTREX) 1000 MG tablet TAKE 1 TABLET BY MOUTH EVERY DAY 07/13/15  Yes [name], MD  atorvastatin (LIPITOR) 40 MG tablet Take 1 tablet (40 mg total) by mouth daily. Patient not taking: Reported on 03/14/2016 03/08/14   [name], MD     No Known Allergies     Objective:  Physical Exam  Constitutional: She is oriented to person, place, and time. She appears well-developed and well-nourished. She is active and cooperative. No distress.  BP 124/72 (BP Location: Right Arm, Patient Position: Sitting, Cuff Size: Normal)   Pulse 81   Temp 98.4 F (36.9 C)   Resp 16   Ht 5' 4.5" (1.638 m)   Wt 130 lb (59 kg)   SpO2 98%   BMI 21.97 kg/m   HENT:  Head: Normocephalic and atraumatic.  Right Ear: Hearing normal.  Left Ear: Hearing normal.  Eyes: Conjunctivae are normal. No scleral icterus.  Neck: Normal range of motion. Neck supple. No thyromegaly present.  Cardiovascular: Normal rate, regular rhythm and normal heart sounds.   Pulses:      Radial pulses are 2+ on the right side, and 2+ on the left side.  Pulmonary/Chest: Effort normal and breath sounds normal.  Lymphadenopathy:       Head (right side): No tonsillar, no preauricular, no posterior auricular and no  occipital adenopathy present.       Head (left side): No tonsillar, no preauricular, no posterior auricular and no occipital adenopathy present.    She has no cervical adenopathy.       Right: No supraclavicular adenopathy present.       Left: No supraclavicular adenopathy present.  Neurological: She is alert and oriented to person, place, and time. No sensory deficit.  Skin: Skin is warm, dry and intact. No rash noted. No cyanosis or erythema. Nails show no clubbing.  Psychiatric: She has a normal mood and affect. Her speech is normal and behavior is  normal.           Assessment & Plan:   1. Herpes labialis Stable. Continue. - valACYclovir (VALTREX) 1000 MG tablet; TAKE 1 TABLET BY MOUTH EVERY DAY  Dispense: 90 tablet; Refill: 3  2. Depression, unspecified depression type Stable/controlled. Continue. May call for alprazolam fills. RTC in 6 months. - buPROPion (WELLBUTRIN XL) 300 MG 24 hr tablet; TAKE 1 TABLET (300 MG TOTAL) BY MOUTH EVERY MORNING.  Dispense: 90 tablet; Refill: 3 - ALPRAZolam (XANAX) 0.5 MG tablet; Take 1 tablet (0.5 mg total) by mouth at bedtime as needed for anxiety.  Dispense: 30 tablet; Refill: 0  3. Hyperlipidemia, unspecified hyperlipidemia type Declined statin therapy, though recommended by Drs. Daub and Ganji.  - aspirin EC 81 MG tablet; Take 81 mg by mouth daily.  Return in about 6 months (around 09/12/2016).    Fernande Bras, PA-C Physician Assistant-Certified [facility]

## 2016-03-14 NOTE — Patient Instructions (Addendum)
Health Maintenance  Topic Date Due  . Hepatitis C Screening  05-12-1956  . HIV Screening  05/11/1972  . TETANUS/TDAP  05/11/1976  . PAP SMEAR  05/11/1978  . MAMMOGRAM  05/12/2007  . INFLUENZA VACCINE  01/08/2017 (Originally 01/09/2016)  . COLONOSCOPY  02/28/2020       IF you received an x-ray today, you will receive an invoice from [facility]. Please contact [facility] at [phone] with questions or concerns regarding your invoice.   IF you received labwork today, you will receive an invoice from [facility]. Please contact [facility] at [phone] with questions or concerns regarding your invoice.   Our billing staff will not be able to assist you with questions regarding bills from these companies.  You will be contacted with the lab results as soon as they are available. The fastest way to get your results is to activate your My Chart account. Instructions are located on the last page of this paperwork. If you have not heard from us regarding the results in 2 weeks, please contact this office.

## 2016-06-06 ENCOUNTER — Other Ambulatory Visit: Payer: Self-pay | Admitting: Physician Assistant

## 2016-06-06 DIAGNOSIS — F329 Major depressive disorder, single episode, unspecified: Secondary | ICD-10-CM

## 2016-06-06 DIAGNOSIS — F32A Depression, unspecified: Secondary | ICD-10-CM

## 2016-06-07 NOTE — Telephone Encounter (Signed)
Last OV 10/5 and was given a Rx then for #30.

## 2016-06-07 NOTE — Telephone Encounter (Signed)
Meds ordered this encounter  Medications  . ALPRAZolam (XANAX) 0.5 MG tablet    Sig: TAKE 1 TABLET BY MOUTH AT BEDTIME AS NEEDED FOR ANXIETY    Dispense:  30 tablet    Refill:  0    Not to exceed 5 additional fills before 09/10/2016

## 2016-06-08 NOTE — Telephone Encounter (Signed)
[facility]

## 2016-06-11 ENCOUNTER — Ambulatory Visit: Payer: BLUE CROSS/BLUE SHIELD | Admitting: Physician Assistant

## 2016-08-08 ENCOUNTER — Other Ambulatory Visit: Payer: Self-pay | Admitting: Physician Assistant

## 2016-08-08 DIAGNOSIS — F329 Major depressive disorder, single episode, unspecified: Secondary | ICD-10-CM

## 2016-08-08 DIAGNOSIS — F32A Depression, unspecified: Secondary | ICD-10-CM

## 2016-08-10 NOTE — Telephone Encounter (Signed)
05/2016 last refill 03/2016 last ov

## 2016-08-12 NOTE — Telephone Encounter (Signed)
[facility] 

## 2016-08-12 NOTE — Telephone Encounter (Signed)
Meds ordered this encounter  Medications  . ALPRAZolam (XANAX) 0.5 MG tablet    Sig: TAKE 1 TABLET AT BEDTIME AS NEEDED FOR SLEEP    Dispense:  30 tablet    Refill:  0    Not to exceed 5 additional fills before 12/04/2016.

## 2016-09-30 ENCOUNTER — Ambulatory Visit (INDEPENDENT_AMBULATORY_CARE_PROVIDER_SITE_OTHER): Payer: BLUE CROSS/BLUE SHIELD | Admitting: Physician Assistant

## 2016-09-30 VITALS — BP 143/85 | HR 68 | Temp 98.5°F | Resp 17 | Ht 64.5 in | Wt 130.0 lb

## 2016-09-30 DIAGNOSIS — R1013 Epigastric pain: Secondary | ICD-10-CM | POA: Diagnosis not present

## 2016-09-30 DIAGNOSIS — F329 Major depressive disorder, single episode, unspecified: Secondary | ICD-10-CM

## 2016-09-30 DIAGNOSIS — F32A Depression, unspecified: Secondary | ICD-10-CM

## 2016-09-30 DIAGNOSIS — E785 Hyperlipidemia, unspecified: Secondary | ICD-10-CM

## 2016-09-30 DIAGNOSIS — R251 Tremor, unspecified: Secondary | ICD-10-CM | POA: Diagnosis not present

## 2016-09-30 MED ORDER — ALPRAZOLAM 0.5 MG PO TABS: 0.5000 mg | ORAL_TABLET | Freq: Every evening | ORAL | 0 refills | Status: DC | PRN

## 2016-09-30 MED ORDER — OMEPRAZOLE 40 MG PO CPDR: 40.0000 mg | DELAYED_RELEASE_CAPSULE | Freq: Every day | ORAL | 3 refills | Status: DC

## 2016-09-30 NOTE — Progress Notes (Signed)
Patient ID: Yvette Ferguson, female    DOB: [birth year], 61 y.o.   MRN: [id number]  PCP: Porfirio Oar, PA-C  Chief Complaint  Patient presents with  . Gastroesophageal Reflux  . Tremors    Gotten worse in last 6 months  . fasting blood work for cholesterol    Subjective:   Presents for evaluation of fasting lipids and prescription refills.  1. On Wellbutrin, and has been for a very long time. Wonders if she should change, or get in to counseling. Has been dealing with a lot of life stressors. Her daughter's marriage is dissolving, and her daughter and two grandchildren have moved in with her. It's wonderful to have them there, but it's hard not to try to solve the issues her daughter is managing and hard to watch her grandchildren suffer through their parents' breakup. Angry, sad. Crying helps. "My [place] name is Mouth Flies Open. I'm worried that I'm making it worse."  2. Ringing in her ears. Feels a little dizzy with lying down at night. Mild spinning. Not every night. Brief.Felt a little unsteady while helping her other daughter move from [place] to [place] last week. May have been associated with rapid position change. Doesn't think it's anything significant, and doesn't want additional evaluation, just wants it documented.  3. Dr. 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 Is cutting back on alcohol. Not exercising as much. "I don't eat bad, I just don't eat." Also not interested in additional evaluation.  4. Dyspepsia has been a long-term issue. Her ex-husband, Matt, advises Maalox before meals. She uses alka-seltzer. Previously took omeprazole. Very uncomfortable all the time. Not worsened by eating. Mother and sister died of pancreatic cancer. Dr. Cleta Alberts ordered Cancer antigen 19-9 and Lipase 07/2015, both normal. Colonoscopy with Dr. Elnoria Howard. At the time, upper endoscopy was not recommended.  5. Notes that her arm falls asleep during the night when she is side  lying. The top arm, that is crossed across her body. Resolves when she moves it.  6. Alprazolam Rx. Doesn't need to fill it now, but would like to have the Rx for when she does.   Review of Systems As above. No CP, SOB, HA. No diarrhea, constipation, melena. No urinary symptoms. No unexplained muscle or joint aches.    Patient Active Problem List   Diagnosis Date Noted  . Menopause 05/13/2012  . Dyspepsia 05/13/2012  . Depression   . Herpes labialis   . Tremor   . Hyperlipidemia      Prior to Admission medications   Medication Sig Start Date End Date Taking? Authorizing Provider  ALPRAZolam Prudy Feeler) 0.5 MG tablet TAKE 1 TABLET AT BEDTIME AS NEEDED FOR SLEEP 08/12/16  Yes Kadejah Sandiford, PA-C  aspirin EC 81 MG tablet Take 81 mg by mouth daily.   Yes Historical Provider, MD  buPROPion (WELLBUTRIN XL) 300 MG 24 hr tablet TAKE 1 TABLET (300 MG TOTAL) BY MOUTH EVERY MORNING. 03/14/16  Yes Achol Azpeitia, PA-C  Multiple Vitamin (MULTIVITAMIN) tablet Take 1 tablet by mouth daily.   Yes Historical Provider, MD  valACYclovir (VALTREX) 1000 MG tablet TAKE 1 TABLET BY MOUTH EVERY DAY 03/14/16  Yes Latarshia Jersey, PA-C     No Known Allergies     Objective:  Physical Exam  Constitutional: She is oriented to person, place, and time. She appears well-developed and well-nourished. She is active and cooperative. No distress.  BP (!) 143/85 (BP Location: Right Arm, Patient Position: Sitting, Cuff Size:  Normal)   Pulse 68   Temp 98.5 F (36.9 C) (Oral)   Resp 17   Ht 5' 4.5" (1.638 m)   Wt 130 lb (59 kg)   SpO2 100%   BMI 21.97 kg/m   HENT:  Head: Normocephalic and atraumatic.  Right Ear: Hearing normal.  Left Ear: Hearing normal.  Eyes: Conjunctivae are normal. No scleral icterus.  Neck: Normal range of motion. Neck supple. No thyromegaly present.  Cardiovascular: Normal rate, regular rhythm and normal heart sounds.   Pulses:      Radial pulses are 2+ on the right side, and 2+ on  the left side.  Pulmonary/Chest: Effort normal and breath sounds normal.  Lymphadenopathy:       Head (right side): No tonsillar, no preauricular, no posterior auricular and no occipital adenopathy present.       Head (left side): No tonsillar, no preauricular, no posterior auricular and no occipital adenopathy present.    She has no cervical adenopathy.       Right: No supraclavicular adenopathy present.       Left: No supraclavicular adenopathy present.  Neurological: She is alert and oriented to person, place, and time. No sensory deficit.  Skin: Skin is warm, dry and intact. No rash noted. No cyanosis or erythema. Nails show no clubbing.  Psychiatric: She has a normal mood and affect. Her speech is normal and behavior is normal. Judgment and thought content normal. Cognition and memory are normal. Depressed: but tearful when describing her concern for her daughter and grandchildren.           Assessment & Plan:   Problem List Items Addressed This Visit    Depression    Encourage CBT. Therapist names provided. Continue bupropion for now.      Relevant Medications   ALPRAZolam (XANAX) 0.5 MG tablet   Tremor - Primary    Worse with reduced alcohol use. Now only consumes alcohol 3-4 evenings/week instead of nightly. Likely also exacerbated by recent increased stress. She will let me know if it worsens.      Relevant Orders   CBC with Differential/Platelet   Comprehensive metabolic panel   TSH   T4, free   Hyperlipidemia    Update lipids.Previously refused statin therapy. Will advise treatment based on results.      Relevant Orders   Comprehensive metabolic panel   Lipid panel   Dyspepsia    Likely exacerbated by recent increased stress. Resume PPI therapy. Refer back to GI to consider UGI evaluation.      Relevant Medications   omeprazole (PRILOSEC) 40 MG capsule   Other Relevant Orders   Ambulatory referral to Gastroenterology       Return in about 6 months  (around 04/01/2017), or if symptoms worsen or fail to improve.   Fernande Bras, PA-C Primary Care at [facility]

## 2016-09-30 NOTE — Assessment & Plan Note (Addendum)
Encourage CBT. Therapist names provided. Continue bupropion for now.

## 2016-09-30 NOTE — Assessment & Plan Note (Signed)
Update lipids.Previously refused statin therapy. Will advise treatment based on results.

## 2016-09-30 NOTE — Assessment & Plan Note (Addendum)
Worse with reduced alcohol use. Now only consumes alcohol 3-4 evenings/week instead of nightly. Likely also exacerbated by recent increased stress. She will let me know if it worsens.

## 2016-09-30 NOTE — Patient Instructions (Addendum)
For therapy -- [facility] (Beth Kincaid, Ernest McCoy, Heather Kitchens, Karla Townsend) - [phone] [facility] (Julie Whitt) - [phone] [facility] - [phone] [facility] - [phone] Bob Mylan - [phone] [facility]  - [phone] (do not file insurance)   IF you received an x-ray today, you will receive an invoice from [facility]. Please contact [facility] at [phone] with questions or concerns regarding your invoice.   IF you received labwork today, you will receive an invoice from [facility]. Please contact [facility] at [phone] with questions or concerns regarding your invoice.   Our billing staff will not be able to assist you with questions regarding bills from these companies.  You will be contacted with the lab results as soon as they are available. The fastest way to get your results is to activate your My Chart account. Instructions are located on the last page of this paperwork. If you have not heard from us regarding the results in 2 weeks, please contact this office.      

## 2016-09-30 NOTE — Assessment & Plan Note (Signed)
Likely exacerbated by recent increased stress. Resume PPI therapy. Refer back to GI to consider UGI evaluation.

## 2016-10-01 LAB — CBC WITH DIFFERENTIAL/PLATELET
Basophils Absolute: 0 10*3/uL (ref 0.0–0.2)
Basos: 0 %
EOS (ABSOLUTE): 0.1 10*3/uL (ref 0.0–0.4)
Eos: 2 %
Hematocrit: 39 % (ref 34.0–46.6)
Hemoglobin: 12.9 g/dL (ref 11.1–15.9)
Immature Grans (Abs): 0 10*3/uL (ref 0.0–0.1)
Immature Granulocytes: 0 %
Lymphocytes Absolute: 2.2 10*3/uL (ref 0.7–3.1)
Lymphs: 42 %
MCH: 31.7 pg (ref 26.6–33.0)
MCHC: 33.1 g/dL (ref 31.5–35.7)
MCV: 96 fL (ref 79–97)
Monocytes Absolute: 0.3 10*3/uL (ref 0.1–0.9)
Monocytes: 5 %
Neutrophils Absolute: 2.7 10*3/uL (ref 1.4–7.0)
Neutrophils: 51 %
Platelets: 243 10*3/uL (ref 150–379)
RBC: 4.07 x10E6/uL (ref 3.77–5.28)
RDW: 13.8 % (ref 12.3–15.4)
WBC: 5.2 10*3/uL (ref 3.4–10.8)

## 2016-10-01 LAB — COMPREHENSIVE METABOLIC PANEL
ALT: 16 IU/L (ref 0–32)
AST: 17 IU/L (ref 0–40)
Albumin/Globulin Ratio: 2.1 (ref 1.2–2.2)
Albumin: 4.4 g/dL (ref 3.5–5.5)
Alkaline Phosphatase: 42 IU/L (ref 39–117)
BUN/Creatinine Ratio: 21 (ref 9–23)
BUN: 18 mg/dL (ref 6–24)
Bilirubin Total: 0.4 mg/dL (ref 0.0–1.2)
CO2: 28 mmol/L (ref 18–29)
Calcium: 9.3 mg/dL (ref 8.7–10.2)
Chloride: 104 mmol/L (ref 96–106)
Creatinine, Ser: 0.87 mg/dL (ref 0.57–1.00)
GFR calc Af Amer: 84 mL/min/{1.73_m2} (ref >59)
GFR calc non Af Amer: 73 mL/min/{1.73_m2} (ref >59)
Globulin, Total: 2.1 g/dL (ref 1.5–4.5)
Glucose: 95 mg/dL (ref 65–99)
Potassium: 4.5 mmol/L (ref 3.5–5.2)
Sodium: 144 mmol/L (ref 134–144)
Total Protein: 6.5 g/dL (ref 6.0–8.5)

## 2016-10-01 LAB — LIPID PANEL
Chol/HDL Ratio: 3.8 ratio (ref 0.0–4.4)
Cholesterol, Total: 256 mg/dL — ABNORMAL HIGH (ref 100–199)
HDL: 68 mg/dL (ref >39)
LDL Calculated: 174 mg/dL — ABNORMAL HIGH (ref 0–99)
Triglycerides: 68 mg/dL (ref 0–149)
VLDL Cholesterol Cal: 14 mg/dL (ref 5–40)

## 2016-10-01 LAB — TSH: TSH: 2.93 u[IU]/mL (ref 0.450–4.500)

## 2016-10-01 LAB — T4, FREE: Free T4: 1.09 ng/dL (ref 0.82–1.77)

## 2016-10-24 ENCOUNTER — Encounter: Payer: Self-pay | Admitting: Physician Assistant

## 2016-10-30 DIAGNOSIS — Z1231 Encounter for screening mammogram for malignant neoplasm of breast: Secondary | ICD-10-CM | POA: Diagnosis not present

## 2016-10-30 DIAGNOSIS — Z01419 Encounter for gynecological examination (general) (routine) without abnormal findings: Secondary | ICD-10-CM | POA: Diagnosis not present

## 2016-10-30 DIAGNOSIS — Z6821 Body mass index (BMI) 21.0-21.9, adult: Secondary | ICD-10-CM | POA: Diagnosis not present

## 2017-01-01 DIAGNOSIS — F4323 Adjustment disorder with mixed anxiety and depressed mood: Secondary | ICD-10-CM | POA: Diagnosis not present

## 2017-01-15 DIAGNOSIS — F4323 Adjustment disorder with mixed anxiety and depressed mood: Secondary | ICD-10-CM | POA: Diagnosis not present

## 2017-01-17 ENCOUNTER — Other Ambulatory Visit: Payer: Self-pay | Admitting: Physician Assistant

## 2017-01-17 DIAGNOSIS — R1013 Epigastric pain: Secondary | ICD-10-CM

## 2017-01-29 DIAGNOSIS — F4323 Adjustment disorder with mixed anxiety and depressed mood: Secondary | ICD-10-CM | POA: Diagnosis not present

## 2017-02-07 ENCOUNTER — Telehealth: Payer: Self-pay | Admitting: Physician Assistant

## 2017-02-07 NOTE — Telephone Encounter (Signed)
Pt is needing a refill on prilosec   Best number is [phone]

## 2017-02-12 DIAGNOSIS — F4323 Adjustment disorder with mixed anxiety and depressed mood: Secondary | ICD-10-CM | POA: Diagnosis not present

## 2017-02-17 NOTE — Telephone Encounter (Signed)
Refill ordered on 8/10 with 3 additional

## 2017-02-25 ENCOUNTER — Ambulatory Visit (INDEPENDENT_AMBULATORY_CARE_PROVIDER_SITE_OTHER): Payer: BLUE CROSS/BLUE SHIELD | Admitting: Physician Assistant

## 2017-02-25 ENCOUNTER — Encounter: Payer: Self-pay | Admitting: Physician Assistant

## 2017-02-25 VITALS — BP 115/77 | HR 65 | Temp 97.9°F | Resp 18 | Ht 64.5 in | Wt 126.6 lb

## 2017-02-25 DIAGNOSIS — B001 Herpesviral vesicular dermatitis: Secondary | ICD-10-CM

## 2017-02-25 DIAGNOSIS — E785 Hyperlipidemia, unspecified: Secondary | ICD-10-CM

## 2017-02-25 DIAGNOSIS — F32A Depression, unspecified: Secondary | ICD-10-CM

## 2017-02-25 DIAGNOSIS — Z1159 Encounter for screening for other viral diseases: Secondary | ICD-10-CM | POA: Diagnosis not present

## 2017-02-25 DIAGNOSIS — R1013 Epigastric pain: Secondary | ICD-10-CM

## 2017-02-25 DIAGNOSIS — F329 Major depressive disorder, single episode, unspecified: Secondary | ICD-10-CM

## 2017-02-25 DIAGNOSIS — Z23 Encounter for immunization: Secondary | ICD-10-CM

## 2017-02-25 DIAGNOSIS — Z114 Encounter for screening for human immunodeficiency virus [HIV]: Secondary | ICD-10-CM | POA: Diagnosis not present

## 2017-02-25 MED ORDER — RANITIDINE HCL 150 MG PO TABS: 150.0000 mg | ORAL_TABLET | Freq: Two times a day (BID) | ORAL | 1 refills | Status: DC

## 2017-02-25 MED ORDER — BUPROPION HCL ER (XL) 300 MG PO TB24: ORAL_TABLET | ORAL | 3 refills | Status: DC

## 2017-02-25 MED ORDER — VALACYCLOVIR HCL 1 G PO TABS: ORAL_TABLET | ORAL | 3 refills | Status: DC

## 2017-02-25 MED ORDER — ALPRAZOLAM 0.5 MG PO TABS: 0.5000 mg | ORAL_TABLET | Freq: Every evening | ORAL | 0 refills | Status: DC | PRN

## 2017-02-25 NOTE — Progress Notes (Signed)
Patient ID: Yvette Ferguson, female    DOB: 1955/07/13, 61 y.o.   MRN: [id number]  PCP: Porfirio Oar, PA-C  Chief Complaint  Patient presents with  . Medication Refill    Pt would like all medications refilled. Pt would like to talk about flu vaccine.    Subjective:   Presents for medication refills.   Omeprazole is helping, but is not completely treating her symptoms. The last time she saw Dr. Elnoria Howard, he prescribed omeprazole hoping to avoid the upper endoscopy. Has not taken it for 2 weeks-ran out.  Another family member (maternal half-brother) has died of pancreatic cancer. Recall that her mother (died at 39) and sister (died at 17) also had pancreatic cancer.  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  Seeing Emiliano Dyer for counseling. Her relationship is improving. She has adopted a new dog.   Review of Systems Dyspepsia. No chest pain, SOB, HA, dizziness, vision change, N/V, diarrhea, constipation, dysuria, urinary urgency or frequency, myalgias, arthralgias or rash.     Patient Active Problem List   Diagnosis Date Noted  . Menopause 05/13/2012  . Dyspepsia 05/13/2012  . Depression   . Herpes labialis   . Tremor   . Hyperlipidemia      Prior to Admission medications   Medication Sig Start Date End Date Taking? Authorizing Provider  ALPRAZolam Prudy Feeler) 0.5 MG tablet Take 1 tablet (0.5 mg total) by mouth at bedtime as needed. for sleep 09/30/16  Yes Porfirio Oar, PA-C  aspirin EC 81 MG tablet Take 81 mg by mouth daily.   Yes [provider]  buPROPion (WELLBUTRIN XL) 300 MG 24 hr tablet TAKE 1 TABLET (300 MG TOTAL) BY MOUTH EVERY MORNING. 03/14/16  Yes Thurmond Hildebran, PA-C  Multiple Vitamin (MULTIVITAMIN) tablet Take 1 tablet by mouth daily.   Yes [provider]  omeprazole (PRILOSEC) 40 MG capsule TAKE 1 CAPSULE BY MOUTH EVERY DAY 01/17/17  Yes Ciara Kagan, PA-C  valACYclovir (VALTREX) 1000 MG tablet TAKE 1 TABLET BY MOUTH EVERY DAY 03/14/16  Yes  Lynnel Zanetti, PA-C     No Known Allergies     Objective:  Physical Exam  Constitutional: She is oriented to person, place, and time. She appears well-developed and well-nourished. She is active and cooperative. No distress.  BP 115/77 (BP Location: Right Arm, Patient Position: Sitting, Cuff Size: Normal)   Pulse 65   Temp 97.9 F (36.6 C) (Oral)   Resp 18   Ht 5' 4.5" (1.638 m)   Wt 126 lb 9.6 oz (57.4 kg)   SpO2 98%   BMI 21.40 kg/m   HENT:  Head: Normocephalic and atraumatic.  Right Ear: Hearing normal.  Left Ear: Hearing normal.  Eyes: Conjunctivae are normal. No scleral icterus.  Neck: Normal range of motion. Neck supple. No thyromegaly present.  Cardiovascular: Normal rate, regular rhythm and normal heart sounds.   Pulses:      Radial pulses are 2+ on the right side, and 2+ on the left side.  Pulmonary/Chest: Effort normal and breath sounds normal.  Lymphadenopathy:       Head (right side): No tonsillar, no preauricular, no posterior auricular and no occipital adenopathy present.       Head (left side): No tonsillar, no preauricular, no posterior auricular and no occipital adenopathy present.    She has no cervical adenopathy.       Right: No supraclavicular adenopathy present.       Left: No supraclavicular adenopathy present.  Neurological: She is  alert and oriented to person, place, and time. No sensory deficit.  Skin: Skin is warm, dry and intact. No rash noted. No cyanosis or erythema. Nails show no clubbing.  Psychiatric: She has a normal mood and affect. Her speech is normal and behavior is normal.           Assessment & Plan:   Problem List Items Addressed This Visit    Depression    Stable. Controlled. Continue current medications and counseling.      Relevant Medications   buPROPion (WELLBUTRIN XL) 300 MG 24 hr tablet   ALPRAZolam (XANAX) 0.5 MG tablet   Herpes labialis    Stable. Continue valacyclovir.      Relevant Medications    valACYclovir (VALTREX) 1000 MG tablet   Hyperlipidemia - Primary    Previously declined treatment, despite recommendations from her cardiologist and previous PCP. Await labs. Recommendations as indicated by results.       Relevant Orders   Comprehensive metabolic panel (Completed)   Lipid panel (Completed)   Dyspepsia    Off omeprazole. Did not experience rebound symptoms. Trial of ranitidine. Follow-up with GI regarding incomplete symptoms control and risks of pancreatic cancer.      Relevant Medications   ranitidine (ZANTAC) 150 MG tablet    Other Visit Diagnoses    Need for influenza vaccination       Relevant Orders   Flu Vaccine QUAD 36+ mos IM (Completed)   Screening for HIV (human immunodeficiency virus)       Relevant Orders   HIV antibody (Completed)   Need for hepatitis C screening test       Relevant Orders   Hepatitis C antibody (Completed)   Need for Tdap vaccination       Relevant Orders   Tdap vaccine greater than or equal to 7yo IM (Completed)   Care order/instruction: (Completed)       Return in about 6 months (around 08/25/2017) for re-evaluation of cholesterol, mood.   Fernande Bras, PA-C Primary Care at [facility]

## 2017-02-25 NOTE — Patient Instructions (Signed)
     IF you received an x-ray today, you will receive an invoice from [facility]. Please contact [facility] at [phone] with questions or concerns regarding your invoice.   IF you received labwork today, you will receive an invoice from [facility]. Please contact [facility] at [phone] with questions or concerns regarding your invoice.   Our billing staff will not be able to assist you with questions regarding bills from these companies.  You will be contacted with the lab results as soon as they are available. The fastest way to get your results is to activate your My Chart account. Instructions are located on the last page of this paperwork. If you have not heard from us regarding the results in 2 weeks, please contact this office.     

## 2017-02-26 LAB — HEPATITIS C ANTIBODY: Hep C Virus Ab: <0.1 s/co ratio (ref 0.0–0.9)

## 2017-02-26 LAB — COMPREHENSIVE METABOLIC PANEL
ALT: 18 IU/L (ref 0–32)
AST: 25 IU/L (ref 0–40)
Albumin/Globulin Ratio: 2.4 — ABNORMAL HIGH (ref 1.2–2.2)
Albumin: 4.7 g/dL (ref 3.5–5.5)
Alkaline Phosphatase: 51 IU/L (ref 39–117)
BUN/Creatinine Ratio: 22 (ref 9–23)
BUN: 19 mg/dL (ref 6–24)
Bilirubin Total: 0.7 mg/dL (ref 0.0–1.2)
CO2: 26 mmol/L (ref 20–29)
Calcium: 9.6 mg/dL (ref 8.7–10.2)
Chloride: 104 mmol/L (ref 96–106)
Creatinine, Ser: 0.86 mg/dL (ref 0.57–1.00)
GFR calc Af Amer: 86 mL/min/{1.73_m2} (ref >59)
GFR calc non Af Amer: 74 mL/min/{1.73_m2} (ref >59)
Globulin, Total: 2 g/dL (ref 1.5–4.5)
Glucose: 88 mg/dL (ref 65–99)
Potassium: 4.4 mmol/L (ref 3.5–5.2)
Sodium: 143 mmol/L (ref 134–144)
Total Protein: 6.7 g/dL (ref 6.0–8.5)

## 2017-02-26 LAB — LIPID PANEL
Chol/HDL Ratio: 3.2 ratio (ref 0.0–4.4)
Cholesterol, Total: 268 mg/dL — ABNORMAL HIGH (ref 100–199)
HDL: 83 mg/dL (ref >39)
LDL Calculated: 173 mg/dL — ABNORMAL HIGH (ref 0–99)
Triglycerides: 62 mg/dL (ref 0–149)
VLDL Cholesterol Cal: 12 mg/dL (ref 5–40)

## 2017-02-26 LAB — HIV ANTIBODY (ROUTINE TESTING W REFLEX): HIV Screen 4th Generation wRfx: NONREACTIVE

## 2017-02-27 DIAGNOSIS — F4323 Adjustment disorder with mixed anxiety and depressed mood: Secondary | ICD-10-CM | POA: Diagnosis not present

## 2017-03-02 NOTE — Assessment & Plan Note (Addendum)
Off omeprazole. Did not experience rebound symptoms. Trial of ranitidine. Follow-up with GI regarding incomplete symptoms control and risks of pancreatic cancer.

## 2017-03-02 NOTE — Assessment & Plan Note (Signed)
Stable. Controlled. Continue current medications and counseling.

## 2017-03-02 NOTE — Assessment & Plan Note (Signed)
Stable. Continue valacyclovir. 

## 2017-03-02 NOTE — Assessment & Plan Note (Signed)
Previously declined treatment, despite recommendations from her cardiologist and previous PCP. Await labs. Recommendations as indicated by results.

## 2017-03-03 ENCOUNTER — Telehealth: Payer: Self-pay | Admitting: Physician Assistant

## 2017-03-03 NOTE — Telephone Encounter (Signed)
Patient would like to discuss what her options are for medications

## 2017-03-03 NOTE — Telephone Encounter (Signed)
Pt is looking for lab results   Best number [phone]

## 2017-03-03 NOTE — Telephone Encounter (Signed)
A statin is the first choice. Alternatives are: fenofibrate, Welchol, Zetia, Niaspan.

## 2017-03-05 MED ORDER — EZETIMIBE 10 MG PO TABS: 10.0000 mg | ORAL_TABLET | Freq: Every day | ORAL | 3 refills | Status: DC

## 2017-03-05 NOTE — Telephone Encounter (Signed)
Called pt. Pt does not want to start on a statin but does trust your judgement in choosing an alternative. Pt would like call once this has been decided and ordered.

## 2017-03-05 NOTE — Telephone Encounter (Signed)
Meds ordered this encounter  Medications  . ezetimibe (ZETIA) 10 MG tablet    Sig: Take 1 tablet (10 mg total) by mouth daily.    Dispense:  90 tablet    Refill:  3    Order Specific Question:   Supervising Provider    Answer:   SHAW, EVA N [[id number]]   Let's recheck lipids in 3 months.

## 2017-03-05 NOTE — Addendum Note (Signed)
Addended by: Fernande Bras on: 03/05/2017 12:31 PM   Modules accepted: Orders

## 2017-03-06 NOTE — Telephone Encounter (Signed)
Patient informed. 

## 2017-03-12 DIAGNOSIS — F4323 Adjustment disorder with mixed anxiety and depressed mood: Secondary | ICD-10-CM | POA: Diagnosis not present

## 2017-03-26 DIAGNOSIS — F4323 Adjustment disorder with mixed anxiety and depressed mood: Secondary | ICD-10-CM | POA: Diagnosis not present

## 2017-04-02 ENCOUNTER — Encounter: Payer: Self-pay | Admitting: Physician Assistant

## 2017-04-02 ENCOUNTER — Ambulatory Visit (INDEPENDENT_AMBULATORY_CARE_PROVIDER_SITE_OTHER): Payer: BLUE CROSS/BLUE SHIELD | Admitting: Physician Assistant

## 2017-04-02 VITALS — BP 138/80 | HR 83 | Temp 98.0°F | Resp 16 | Ht 65.75 in | Wt 127.0 lb

## 2017-04-02 DIAGNOSIS — M5442 Lumbago with sciatica, left side: Secondary | ICD-10-CM | POA: Diagnosis not present

## 2017-04-02 DIAGNOSIS — M25552 Pain in left hip: Secondary | ICD-10-CM

## 2017-04-02 MED ORDER — MELOXICAM 15 MG PO TABS: 15.0000 mg | ORAL_TABLET | Freq: Every day | ORAL | 0 refills | Status: DC

## 2017-04-02 NOTE — Progress Notes (Signed)
Patient ID: Yvette Ferguson, female    DOB: 03/02/1956, 61 y.o.   MRN: [id number]  PCP: Porfirio Oar, PA-C  Chief Complaint  Patient presents with  . Muscle Pain    pt states she has been having pain on the left side near the pelvic and hip area x 3 weeks. Pt states she feels like she pulled a muscle lifting some bricks about 3 weeks ago.     Subjective:   Presents for evaluation of LEFT groin pain x 3 weeks.  The pain began after she moved a large pile of bricks in her yard and occurs with standing, walking, stair climbing and sudden movement to the LEFT. She describes a "catching" or "locking" sensation, and that she may fall if she isn't careful, and rates it a 5/10. The when the pain occurs she has a sharp, sudden pain, lasting only a moment, then a dull ache while she walks.  Tingling sensation in the LEFT leg x 3 days. Occasional tripping, no falls. Ibuprofen helps. Once she gets comfortable, sleeping/rest helps.  Today she noted pain in the RIGHT leg, she thinks due to compensating for the pain on the LEFT, as well as back pain. She spends a lot of time standing on hard floors and climbing stairs, both of which she thinks aggravate the pain, and is due to old age.  She started Zetia 1 month ago for cholesterol, and read that it can cause leg pain.    Review of Systems As above.    Patient Active Problem List   Diagnosis Date Noted  . Menopause 05/13/2012  . Dyspepsia 05/13/2012  . Depression   . Herpes labialis   . Tremor   . Hyperlipidemia      Prior to Admission medications   Medication Sig Start Date End Date Taking? Authorizing Provider  ALPRAZolam Prudy Feeler) 0.5 MG tablet Take 1 tablet (0.5 mg total) by mouth at bedtime as needed. for sleep 02/25/17  Yes Pernie Grosso, PA-C  buPROPion (WELLBUTRIN XL) 300 MG 24 hr tablet TAKE 1 TABLET (300 MG TOTAL) BY MOUTH EVERY MORNING. 02/25/17  Yes Andric Kerce, PA-C  ezetimibe (ZETIA) 10 MG tablet Take 1  tablet (10 mg total) by mouth daily. 03/05/17  Yes Terrilyn Tyner, PA-C  Multiple Vitamin (MULTIVITAMIN) tablet Take 1 tablet by mouth daily.   Yes [provider]  ranitidine (ZANTAC) 150 MG tablet Take 1 tablet (150 mg total) by mouth 2 (two) times daily. 02/25/17  Yes Nadim Malia, PA-C  valACYclovir (VALTREX) 1000 MG tablet TAKE 1 TABLET BY MOUTH EVERY DAY 02/25/17  Yes Phi Avans, PA-C     No Known Allergies     Objective:  Physical Exam  Constitutional: She is oriented to person, place, and time. She appears well-developed and well-nourished. She is active and cooperative. No distress.  BP 138/80   Pulse 83   Temp 98 F (36.7 C) (Oral)   Resp 16   Ht 5' 5.75" (1.67 m)   Wt 127 lb (57.6 kg)   SpO2 98%   BMI 20.66 kg/m   HENT:  Head: Normocephalic and atraumatic.  Right Ear: Hearing normal.  Left Ear: Hearing normal.  Eyes: Conjunctivae are normal. No scleral icterus.  Neck: Normal range of motion. Neck supple. No thyromegaly present.  Cardiovascular: Normal rate, regular rhythm and normal heart sounds.  Pulses:      Radial pulses are 2+ on the right side, and 2+ on the left side.  Pulmonary/Chest:  Effort normal and breath sounds normal.  Musculoskeletal:       Right hip: Normal.       Left hip: She exhibits decreased range of motion and tenderness. She exhibits normal strength, no bony tenderness, no swelling, no crepitus, no deformity and no laceration.       Right knee: Normal.       Left knee: Normal.       Thoracic back: Normal.       Lumbar back: She exhibits tenderness and pain. She exhibits normal range of motion, no bony tenderness, no swelling, no edema, no deformity, no laceration, no spasm and normal pulse.       Right upper leg: Normal.       Left upper leg: Normal.  Lymphadenopathy:       Head (right side): No tonsillar, no preauricular, no posterior auricular and no occipital adenopathy present.       Head (left side): No tonsillar, no  preauricular, no posterior auricular and no occipital adenopathy present.    She has no cervical adenopathy.       Right: No supraclavicular adenopathy present.       Left: No supraclavicular adenopathy present.  Neurological: She is alert and oriented to person, place, and time. No sensory deficit.  Skin: Skin is warm, dry and intact. No rash noted. No cyanosis or erythema. Nails show no clubbing.  Psychiatric: She has a normal mood and affect. Her speech is normal and behavior is normal.           Assessment & Plan:   1. Left hip pain 2. Acute left-sided low back pain with left-sided sciatica Likely strain/overuse. She declines radiographs today, but will return if the pain worsens/persists. Rest. NSAIDS. - meloxicam (MOBIC) 15 MG tablet; Take 1 tablet (15 mg total) by mouth daily.  Dispense: 30 tablet; Refill: 0    Return if symptoms worsen or fail to improve in 2 weeks.   Fernande Bras, PA-C Primary Care at [facility]

## 2017-04-02 NOTE — Patient Instructions (Addendum)
Continue the ice, and what rest you can get.    IF you received an x-ray today, you will receive an [facility] from [facility]. Please contact [facility] at [phone] with questions or concerns regarding your invoice.   IF you received labwork today, you will receive an invoice from [facility]. Please contact [facility] at [phone] with questions or concerns regarding your invoice.   Our billing staff will not be able to assist you with questions regarding bills from these companies.  You will be contacted with the lab results as soon as they are available. The fastest way to get your results is to activate your My Chart account. Instructions are located on the last page of this paperwork. If you have not heard from us regarding the results in 2 weeks, please contact this office.

## 2017-04-02 NOTE — Progress Notes (Signed)
Subjective:    Patient ID: Yvette Ferguson, female    DOB: [birth year], 61 y.o.   MRN: [id number]  HPI  Yvette Ferguson is a 61 year old female with no chronic medical history pertinent to today's visit who presents today with hip pain. Yvette Ferguson states the pain started in her left groin area 3 weeks ago after she was moving bricks. She reports the pain occurs when she is standing, climbing stairs, walking, or does a sudden movement to her left. Yvette Ferguson states it feels like her left leg "catches" or "locks" and if she doesn't pay attention to it she will fall. She reports the pain is a 5/10 when it happens. Yvette Ferguson describes the pain as a sharp pain that lasts for a second. She states it then turns into a dull ache when she walks. Yvette Ferguson also reports some numbness and tingling in her left leg for the past 3 days. Yvette Ferguson states she trips on occasion, but has not fallen. She reports Advil helps relieve some of the pain, as well as sleeping once she gets comfortable.   Yvette Ferguson states that her right leg started to bother her today too. She thinks it may be due to over-compensating on the left. Yvette Ferguson states she has to go up and down 3 flights of stairs right now which is causing her more pain.  Yvette Ferguson is also complaining of back pain. She states that the back pain worsens with walking especially on concrete floors when she is at antique shows. Patient states she believes it is due to old age.   Of note, Yvette Ferguson recently started Zetia 1 month ago. She reports she read that leg pain can be a side effect.   Medications: Prior to Admission medications   Medication Sig Start Date End Date Taking? Authorizing Provider  ALPRAZolam Prudy Feeler) 0.5 MG tablet Take 1 tablet (0.5 mg total) by mouth at bedtime as needed. for sleep 02/25/17  Yes Jeffery, Chelle, PA-C  buPROPion (WELLBUTRIN XL) 300 MG 24 hr tablet TAKE 1 TABLET (300 MG TOTAL) BY MOUTH  EVERY MORNING. 02/25/17  Yes Jeffery, Chelle, PA-C  ezetimibe (ZETIA) 10 MG tablet Take 1 tablet (10 mg total) by mouth daily. 03/05/17  Yes Jeffery, Chelle, PA-C  Multiple Vitamin (MULTIVITAMIN) tablet Take 1 tablet by mouth daily.   Yes [provider]  ranitidine (ZANTAC) 150 MG tablet Take 1 tablet (150 mg total) by mouth 2 (two) times daily. 02/25/17  Yes Jeffery, Chelle, PA-C  valACYclovir (VALTREX) 1000 MG tablet TAKE 1 TABLET BY MOUTH EVERY DAY 02/25/17  Yes Jeffery, Chelle, PA-C   Allergies: No Known Allergies  Chronic Medical Conditions: Patient Active Problem List   Diagnosis Date Noted  . Menopause 05/13/2012  . Dyspepsia 05/13/2012  . Depression   . Herpes labialis   . Tremor   . Hyperlipidemia     Surgical History: Past Surgical History:  Procedure Laterality Date  . ABDOMINAL HYSTERECTOMY  2001  . BREAST SURGERY  2000  . eyelid surgery     Review of Systems     Objective:   Physical Exam  Constitutional: She appears well-developed and well-nourished. She is cooperative.  BP 138/80   Pulse 83   Temp 98 F (36.7 C) (Oral)   Resp 16   Ht 5' 5.75" (1.67 m)   Wt 127 lb (57.6 kg)   SpO2 98%   BMI 20.66 kg/m    Neck: Normal range of motion  and full passive range of motion without pain. Neck supple. No muscular tenderness present.  Musculoskeletal:       Right hip: She exhibits normal range of motion, normal strength, no tenderness, no bony tenderness and no swelling.       Left hip: She exhibits decreased range of motion and tenderness. She exhibits normal strength, no bony tenderness, no swelling and no deformity.       Right knee: She exhibits normal range of motion, no swelling, no effusion and no deformity.       Left knee: She exhibits normal range of motion, no swelling, no effusion and no deformity.       Lumbar back: She exhibits tenderness and pain. She exhibits normal range of motion, no bony tenderness, no swelling, no edema, no deformity, no  laceration and no spasm.  Neurological: She is alert. No sensory deficit.  Reflex Scores:      Patellar reflexes are 2+ on the right side and 2+ on the left side.      Achilles reflexes are 2+ on the right side and 2+ on the left side.     Assessment & Plan:  1. Left hip pain - Begin Meloxicam 15 mg daily  - Encouraged patient to continue to ice and rest as much as possible - Patient would like to defer an x-ray at today's visit. She will return to clinic in 2 weeks if still having pain and will send for an x-ray and refer to physical therapy at that time  2. Acute left-sided low back pain with left-sided sciatica - Begin Meloxicam 15 mg daily  - Encouraged patient to continue to ice and rest as much as possible - Patient would like to defer an x-ray at today's visit. She will return to clinic in 2 weeks if still having pain and will send for an x-ray and refer to physical therapy at that time  Judie Petit, PA-S

## 2017-04-09 DIAGNOSIS — F4323 Adjustment disorder with mixed anxiety and depressed mood: Secondary | ICD-10-CM | POA: Diagnosis not present

## 2017-04-11 ENCOUNTER — Encounter: Payer: Self-pay | Admitting: Physician Assistant

## 2017-04-30 ENCOUNTER — Other Ambulatory Visit: Payer: Self-pay | Admitting: Physician Assistant

## 2017-04-30 ENCOUNTER — Telehealth: Payer: Self-pay

## 2017-04-30 DIAGNOSIS — M25552 Pain in left hip: Secondary | ICD-10-CM

## 2017-04-30 NOTE — Telephone Encounter (Signed)
Pt needs an OV for further refills  

## 2017-04-30 NOTE — Telephone Encounter (Signed)
Left message to call back regarding refill request.

## 2017-04-30 NOTE — Telephone Encounter (Signed)
Pt has no refills; MD note states that patient should come back if she is not getting better; Does she need appt before prescription can be refilled?

## 2017-05-07 DIAGNOSIS — F4323 Adjustment disorder with mixed anxiety and depressed mood: Secondary | ICD-10-CM | POA: Diagnosis not present

## 2017-06-06 NOTE — Telephone Encounter (Signed)
Error-Close Encounter 

## 2017-06-18 DIAGNOSIS — F4323 Adjustment disorder with mixed anxiety and depressed mood: Secondary | ICD-10-CM | POA: Diagnosis not present

## 2017-06-19 DIAGNOSIS — F4323 Adjustment disorder with mixed anxiety and depressed mood: Secondary | ICD-10-CM | POA: Diagnosis not present

## 2017-08-20 ENCOUNTER — Other Ambulatory Visit: Payer: Self-pay | Admitting: Physician Assistant

## 2017-08-20 DIAGNOSIS — R1013 Epigastric pain: Secondary | ICD-10-CM

## 2017-09-15 ENCOUNTER — Encounter: Payer: Self-pay | Admitting: Physician Assistant

## 2017-11-10 DIAGNOSIS — E559 Vitamin D deficiency, unspecified: Secondary | ICD-10-CM | POA: Diagnosis not present

## 2017-11-10 DIAGNOSIS — Z808 Family history of malignant neoplasm of other organs or systems: Secondary | ICD-10-CM | POA: Diagnosis not present

## 2017-11-10 DIAGNOSIS — N958 Other specified menopausal and perimenopausal disorders: Secondary | ICD-10-CM | POA: Diagnosis not present

## 2017-11-10 DIAGNOSIS — Z6821 Body mass index (BMI) 21.0-21.9, adult: Secondary | ICD-10-CM | POA: Diagnosis not present

## 2017-11-10 DIAGNOSIS — Z13820 Encounter for screening for osteoporosis: Secondary | ICD-10-CM | POA: Diagnosis not present

## 2017-11-10 DIAGNOSIS — Z01419 Encounter for gynecological examination (general) (routine) without abnormal findings: Secondary | ICD-10-CM | POA: Diagnosis not present

## 2017-11-10 DIAGNOSIS — Z800 Family history of malignant neoplasm of digestive organs: Secondary | ICD-10-CM | POA: Diagnosis not present

## 2017-11-10 DIAGNOSIS — R5383 Other fatigue: Secondary | ICD-10-CM | POA: Diagnosis not present

## 2017-11-10 DIAGNOSIS — E785 Hyperlipidemia, unspecified: Secondary | ICD-10-CM | POA: Diagnosis not present

## 2017-11-10 DIAGNOSIS — Z13220 Encounter for screening for lipoid disorders: Secondary | ICD-10-CM | POA: Diagnosis not present

## 2017-11-10 DIAGNOSIS — Z1321 Encounter for screening for nutritional disorder: Secondary | ICD-10-CM | POA: Diagnosis not present

## 2017-11-10 DIAGNOSIS — Z1231 Encounter for screening mammogram for malignant neoplasm of breast: Secondary | ICD-10-CM | POA: Diagnosis not present

## 2017-11-10 LAB — BASIC METABOLIC PANEL
BUN: 17 (ref 4–21)
Creatinine: 0.9 (ref <=1.1)
Glucose: 93

## 2017-11-10 LAB — LIPID PANEL
Cholesterol: 266 — AB (ref 0–200)
Cholesterol: 266 — AB (ref 0–200)
HDL: 65 (ref 35–70)
HDL: 65 (ref 35–70)
Triglycerides: 102 (ref 40–160)
Triglycerides: 102 (ref 40–160)

## 2017-11-10 LAB — HEPATIC FUNCTION PANEL
ALT: 15 (ref 7–35)
AST: 22 (ref 13–35)

## 2017-11-10 LAB — TSH
TSH: 1.87 (ref <=5.90)
TSH: 1.87 (ref <=5.90)

## 2017-11-10 LAB — VITAMIN D 25 HYDROXY (VIT D DEFICIENCY, FRACTURES): Vit D, 25-Hydroxy: 29

## 2017-12-23 DIAGNOSIS — Z809 Family history of malignant neoplasm, unspecified: Secondary | ICD-10-CM | POA: Diagnosis not present

## 2017-12-25 ENCOUNTER — Encounter: Payer: Self-pay | Admitting: *Deleted

## 2018-01-01 ENCOUNTER — Encounter: Payer: Self-pay | Admitting: *Deleted

## 2018-01-14 DIAGNOSIS — R1013 Epigastric pain: Secondary | ICD-10-CM | POA: Diagnosis not present

## 2018-03-20 DIAGNOSIS — L820 Inflamed seborrheic keratosis: Secondary | ICD-10-CM | POA: Diagnosis not present

## 2018-03-20 DIAGNOSIS — L723 Sebaceous cyst: Secondary | ICD-10-CM | POA: Diagnosis not present

## 2018-04-28 DIAGNOSIS — M25551 Pain in right hip: Secondary | ICD-10-CM | POA: Diagnosis not present

## 2018-04-28 DIAGNOSIS — F329 Major depressive disorder, single episode, unspecified: Secondary | ICD-10-CM | POA: Diagnosis not present

## 2018-04-28 DIAGNOSIS — M25511 Pain in right shoulder: Secondary | ICD-10-CM | POA: Insufficient documentation

## 2018-04-28 DIAGNOSIS — G8929 Other chronic pain: Secondary | ICD-10-CM | POA: Insufficient documentation

## 2018-04-28 DIAGNOSIS — Z23 Encounter for immunization: Secondary | ICD-10-CM | POA: Diagnosis not present

## 2018-04-28 DIAGNOSIS — M25512 Pain in left shoulder: Secondary | ICD-10-CM | POA: Diagnosis not present

## 2018-04-28 DIAGNOSIS — E785 Hyperlipidemia, unspecified: Secondary | ICD-10-CM | POA: Insufficient documentation

## 2018-04-28 DIAGNOSIS — F32A Depression, unspecified: Secondary | ICD-10-CM | POA: Insufficient documentation

## 2018-04-28 DIAGNOSIS — R251 Tremor, unspecified: Secondary | ICD-10-CM | POA: Insufficient documentation

## 2018-04-28 DIAGNOSIS — B001 Herpesviral vesicular dermatitis: Secondary | ICD-10-CM | POA: Insufficient documentation

## 2018-07-29 DIAGNOSIS — Z111 Encounter for screening for respiratory tuberculosis: Secondary | ICD-10-CM | POA: Diagnosis not present

## 2018-07-29 DIAGNOSIS — F329 Major depressive disorder, single episode, unspecified: Secondary | ICD-10-CM | POA: Diagnosis not present

## 2018-07-29 DIAGNOSIS — E785 Hyperlipidemia, unspecified: Secondary | ICD-10-CM | POA: Diagnosis not present

## 2018-10-27 DIAGNOSIS — E785 Hyperlipidemia, unspecified: Secondary | ICD-10-CM | POA: Diagnosis not present

## 2018-10-27 DIAGNOSIS — F329 Major depressive disorder, single episode, unspecified: Secondary | ICD-10-CM | POA: Diagnosis not present

## 2018-11-05 DIAGNOSIS — E785 Hyperlipidemia, unspecified: Secondary | ICD-10-CM | POA: Diagnosis not present

## 2018-12-15 DIAGNOSIS — F419 Anxiety disorder, unspecified: Secondary | ICD-10-CM | POA: Insufficient documentation

## 2018-12-16 DIAGNOSIS — Z01419 Encounter for gynecological examination (general) (routine) without abnormal findings: Secondary | ICD-10-CM | POA: Diagnosis not present

## 2018-12-16 DIAGNOSIS — M25552 Pain in left hip: Secondary | ICD-10-CM | POA: Diagnosis not present

## 2018-12-16 DIAGNOSIS — Z1231 Encounter for screening mammogram for malignant neoplasm of breast: Secondary | ICD-10-CM | POA: Diagnosis not present

## 2018-12-16 DIAGNOSIS — Z6822 Body mass index (BMI) 22.0-22.9, adult: Secondary | ICD-10-CM | POA: Diagnosis not present

## 2019-01-07 DIAGNOSIS — M25552 Pain in left hip: Secondary | ICD-10-CM | POA: Diagnosis not present

## 2019-01-07 DIAGNOSIS — M1612 Unilateral primary osteoarthritis, left hip: Secondary | ICD-10-CM | POA: Diagnosis not present

## 2020-01-05 DIAGNOSIS — M25512 Pain in left shoulder: Secondary | ICD-10-CM | POA: Insufficient documentation

## 2020-01-11 ENCOUNTER — Other Ambulatory Visit: Payer: Self-pay | Admitting: Obstetrics and Gynecology

## 2020-01-11 DIAGNOSIS — R928 Other abnormal and inconclusive findings on diagnostic imaging of breast: Secondary | ICD-10-CM

## 2020-01-17 ENCOUNTER — Other Ambulatory Visit: Payer: Self-pay

## 2020-02-01 DIAGNOSIS — M25552 Pain in left hip: Secondary | ICD-10-CM | POA: Insufficient documentation

## 2020-02-15 ENCOUNTER — Other Ambulatory Visit: Payer: Self-pay | Admitting: Obstetrics and Gynecology

## 2020-02-15 ENCOUNTER — Other Ambulatory Visit: Payer: Self-pay

## 2020-02-15 ENCOUNTER — Ambulatory Visit
Admission: RE | Admit: 2020-02-15 | Discharge: 2020-02-15 | Disposition: A | Discharge status: Home / Self Care | Payer: 59 | Source: Ambulatory Visit | Attending: Obstetrics and Gynecology | Admitting: Obstetrics and Gynecology

## 2020-02-15 ENCOUNTER — Ambulatory Visit: Payer: Self-pay

## 2020-02-15 DIAGNOSIS — R928 Other abnormal and inconclusive findings on diagnostic imaging of breast: Secondary | ICD-10-CM

## 2020-03-29 DIAGNOSIS — K219 Gastro-esophageal reflux disease without esophagitis: Secondary | ICD-10-CM | POA: Insufficient documentation

## 2020-04-24 NOTE — Progress Notes (Addendum)
COVID Vaccine Completed:  No Date COVID Vaccine completed: COVID vaccine manufacturer: Pfizer    [facility]   PCP - Dr. Porfirio Oar, MD Cardiologist -   Chest x-ray -  EKG - 03-29-20 in [facility] Stress Test - 12-19-14 in Epic.  Negative ECHO -  Cardiac Cath -  Pacemaker/ICD device last checked:  Sleep Study -  CPAP -   Fasting Blood Sugar -  Checks Blood Sugar _____ times a day  Blood Thinner Instructions: Aspirin Instructions: Last Dose:  Anesthesia review:   Patient denies shortness of breath, fever, cough and chest pain at PAT appointment.  Able to climb a flight of stairs.  Able to perform ADLs without difficulty.   Patient verbalized understanding of instructions that were given to them at the PAT appointment. Patient was also instructed that they will need to review over the PAT instructions again at home before surgery.

## 2020-04-24 NOTE — Patient Instructions (Addendum)
DUE TO COVID-19 ONLY ONE VISITOR IS ALLOWED TO COME WITH YOU AND STAY IN THE WAITING ROOM ONLY DURING PRE OP AND PROCEDURE.   IF YOU WILL BE ADMITTED INTO THE HOSPITAL YOU ARE ALLOWED ONE SUPPORT PERSON DURING VISITATION HOURS ONLY (10AM -8PM)   . The support person may change daily. . The support person must pass our screening, gel in and out, and wear a mask at all times, including in the patient's room. . Patients must also wear a mask when staff or their support person are in the room.   COVID SWAB TESTING MUST BE COMPLETED ON:  Friday, 05-05-20 @ 2:35 PM   [address] [place], [place] [address]  (Must self quarantine after testing. Follow instructions on handout.)        Your procedure is scheduled on:   Tuesday, 05-09-20   Report to [facility] Main  Entrance   Report to admitting at 6:10 AM   Call this number if you have problems the morning of surgery [phone]   Do not eat food :After Midnight.   May have liquids until 5:40 AM  day of surgery  CLEAR LIQUID DIET  Foods Allowed                                                                     Foods Excluded  Water, Black Coffee and tea, regular and decaf             liquids that you cannot  Plain Jell-O in any flavor  (No red)                                    see through such as: Fruit ices (not with fruit pulp)                                      milk, soups, orange juice              Iced Popsicles (No red)                                      All solid food                                   Apple juices Sports drinks like Gatorade (No red) Lightly seasoned clear broth or consume(fat free)  Sugar, honey syrup    Complete one Ensure drink the morning of surgery at 5:40 AM  the day of surgery.   Oral Hygiene is also important to reduce your risk of infection.                                    Remember - BRUSH YOUR TEETH THE MORNING OF SURGERY WITH YOUR REGULAR TOOTHPASTE   Do NOT smoke after  Midnight   Take these medicines the morning  of surgery with A SIP OF WATER: Wellbutrin, Lexapro, Zetia                               You may not have any metal on your body including hair pins, jewelry, and body piercings             Do not wear make-up, lotions, powders, perfumes/cologne, or deodorant             Do not wear nail polish.  Do not shave  48 hours prior to surgery.            Do not bring valuables to the hospital. [facility] IS NOT RESPONSIBLE   FOR VALUABLES.   Contacts, dentures or bridgework may not be worn into surgery.   Patients discharged the day of surgery will not be allowed to drive home.   Special Instructions: Bring a copy of your healthcare power of attorney and living will documents         the day of surgery if you haven't scanned them in before.              Please read over the following fact sheets you were given: IF YOU HAVE QUESTIONS ABOUT YOUR PRE OP INSTRUCTIONS PLEASE CALL [phone]   [facility] - Preparing for Surgery Before surgery, you can play an important role.  Because skin is not sterile, your skin needs to be as free of germs as possible.  You can reduce the number of germs on your skin by washing with CHG (chlorahexidine gluconate) soap before surgery.  CHG is an antiseptic cleaner which kills germs and bonds with the skin to continue killing germs even after washing. Please DO NOT use if you have an allergy to CHG or antibacterial soaps.  If your skin becomes reddened/irritated stop using the CHG and inform your nurse when you arrive at Short Stay. Do not shave (including legs and underarms) for at least 48 hours prior to the first CHG shower.  You may shave your face/neck.  Please follow these instructions carefully:  1.  Shower with CHG Soap the night before surgery and the  morning of surgery.  2.  If you choose to wash your hair, wash your hair first as usual with your normal  shampoo.  3.  After you shampoo, rinse your hair and  body thoroughly to remove the shampoo.                             4.  Use CHG as you would any other liquid soap.  You can apply chg directly to the skin and wash.  Gently with a scrungie or clean washcloth.  5.  Apply the CHG Soap to your body ONLY FROM THE NECK DOWN.   Do   not use on face/ open                           Wound or open sores. Avoid contact with eyes, ears mouth and   genitals (private parts).                       Wash face,  Genitals (private parts) with your normal soap.             6.  Wash thoroughly, paying special attention to the  area where your    surgery  will be performed.  7.  Thoroughly rinse your body with warm water from the neck down.  8.  DO NOT shower/wash with your normal soap after using and rinsing off the CHG Soap.                9.  Pat yourself dry with a clean towel.            10.  Wear clean pajamas.            11.  Place clean sheets on your bed the night of your first shower and do not  sleep with pets. Day of Surgery : Do not apply any lotions/deodorants the morning of surgery.  Please wear clean clothes to the hospital/surgery center.  FAILURE TO FOLLOW THESE INSTRUCTIONS MAY RESULT IN THE CANCELLATION OF YOUR SURGERY  PATIENT SIGNATURE_________________________________  NURSE SIGNATURE__________________________________  ________________________________________________________________________   Yvette Ferguson  An incentive spirometer is a tool that can help keep your lungs clear and active. This tool measures how well you are filling your lungs with each breath. Taking long deep breaths may help reverse or decrease the chance of developing breathing (pulmonary) problems (especially infection) following:  A long period of time when you are unable to move or be active. BEFORE THE PROCEDURE   If the spirometer includes an indicator to show your best effort, your nurse or respiratory therapist will set it to a desired goal.  If  possible, sit up straight or lean slightly forward. Try not to slouch.  Hold the incentive spirometer in an upright position. INSTRUCTIONS FOR USE  1. Sit on the edge of your bed if possible, or sit up as far as you can in bed or on a chair. 2. Hold the incentive spirometer in an upright position. 3. Breathe out normally. 4. Place the mouthpiece in your mouth and seal your lips tightly around it. 5. Breathe in slowly and as deeply as possible, raising the piston or the ball toward the top of the column. 6. Hold your breath for 3-5 seconds or for as long as possible. Allow the piston or ball to fall to the bottom of the column. 7. Remove the mouthpiece from your mouth and breathe out normally. 8. Rest for a few seconds and repeat Steps 1 through 7 at least 10 times every 1-2 hours when you are awake. Take your time and take a few normal breaths between deep breaths. 9. The spirometer may include an indicator to show your best effort. Use the indicator as a goal to work toward during each repetition. 10. After each set of 10 deep breaths, practice coughing to be sure your lungs are clear. If you have an incision (the cut made at the time of surgery), support your incision when coughing by placing a pillow or rolled up towels firmly against it. Once you are able to get out of bed, walk around indoors and cough well. You may stop using the incentive spirometer when instructed by your caregiver.  RISKS AND COMPLICATIONS  Take your time so you do not get dizzy or light-headed.  If you are in pain, you may need to take or ask for pain medication before doing incentive spirometry. It is harder to take a deep breath if you are having pain. AFTER USE  Rest and breathe slowly and easily.  It can be helpful to keep track of a log of your progress. Your caregiver can provide you  with a simple table to help with this. If you are using the spirometer at home, follow these instructions: SEEK MEDICAL CARE  IF:   You are having difficultly using the spirometer.  You have trouble using the spirometer as often as instructed.  Your pain medication is not giving enough relief while using the spirometer.  You develop fever of 100.5 F (38.1 C) or higher. SEEK IMMEDIATE MEDICAL CARE IF:   You cough up bloody sputum that had not been present before.  You develop fever of 102 F (38.9 C) or greater.  You develop worsening pain at or near the incision site. MAKE SURE YOU:   Understand these instructions.  Will watch your condition.  Will get help right away if you are not doing well or get worse. Document Released: 10/07/2006 Document Revised: 08/19/2011 Document Reviewed: 12/08/2006 [employer] Patient Information 2014 [employer], [place].   ________________________________________________________________________  WHAT IS A BLOOD TRANSFUSION? Blood Transfusion Information  A transfusion is the replacement of blood or some of its parts. Blood is made up of multiple cells which provide different functions.  Red blood cells carry oxygen and are used for blood loss replacement.  White blood cells fight against infection.  Platelets control bleeding.  Plasma helps clot blood.  Other blood products are available for specialized needs, such as hemophilia or other clotting disorders. BEFORE THE TRANSFUSION  Who gives blood for transfusions?   Healthy volunteers who are fully evaluated to make sure their blood is safe. This is blood bank blood. Transfusion therapy is the safest it has ever been in the practice of medicine. Before blood is taken from a donor, a complete history is taken to make sure that person has no history of diseases nor engages in risky social behavior (examples are intravenous drug use or sexual activity with multiple partners). The donor's travel history is screened to minimize risk of transmitting infections, such as malaria. The donated blood is tested for signs of  infectious diseases, such as HIV and hepatitis. The blood is then tested to be sure it is compatible with you in order to minimize the chance of a transfusion reaction. If you or a relative donates blood, this is often done in anticipation of surgery and is not appropriate for emergency situations. It takes many days to process the donated blood. RISKS AND COMPLICATIONS Although transfusion therapy is very safe and saves many lives, the main dangers of transfusion include:   Getting an infectious disease.  Developing a transfusion reaction. This is an allergic reaction to something in the blood you were given. Every precaution is taken to prevent this. The decision to have a blood transfusion has been considered carefully by your caregiver before blood is given. Blood is not given unless the benefits outweigh the risks. AFTER THE TRANSFUSION  Right after receiving a blood transfusion, you will usually feel much better and more energetic. This is especially true if your red blood cells have gotten low (anemic). The transfusion raises the level of the red blood cells which carry oxygen, and this usually causes an energy increase.  The nurse administering the transfusion will monitor you carefully for complications. HOME CARE INSTRUCTIONS  No special instructions are needed after a transfusion. You may find your energy is better. Speak with your caregiver about any limitations on activity for underlying diseases you may have. SEEK MEDICAL CARE IF:   Your condition is not improving after your transfusion.  You develop redness or irritation at the intravenous (IV) site.  SEEK IMMEDIATE MEDICAL CARE IF:  Any of the following symptoms occur over the next 12 hours:  Shaking chills.  You have a temperature by mouth above 102 F (38.9 C), not controlled by medicine.  Chest, back, or muscle pain.  People around you feel you are not acting correctly or are confused.  Shortness of breath or  difficulty breathing.  Dizziness and fainting.  You get a rash or develop hives.  You have a decrease in urine output.  Your urine turns a dark color or changes to pink, red, or brown. Any of the following symptoms occur over the next 10 days:  You have a temperature by mouth above 102 F (38.9 C), not controlled by medicine.  Shortness of breath.  Weakness after normal activity.  The white part of the eye turns yellow (jaundice).  You have a decrease in the amount of urine or are urinating less often.  Your urine turns a dark color or changes to pink, red, or brown. Document Released: 05/24/2000 Document Revised: 08/19/2011 Document Reviewed: 01/11/2008 [facility] Patient Information 2014 [place], [place].  _______________________________________________________________________

## 2020-04-27 ENCOUNTER — Other Ambulatory Visit: Payer: Self-pay

## 2020-04-27 ENCOUNTER — Encounter (HOSPITAL_COMMUNITY)
Admission: RE | Admit: 2020-04-27 | Discharge: 2020-04-27 | Disposition: A | Discharge status: Home / Self Care | Payer: 59 | Source: Ambulatory Visit | Attending: Orthopedic Surgery | Admitting: Orthopedic Surgery

## 2020-04-27 ENCOUNTER — Encounter (HOSPITAL_COMMUNITY): Payer: Self-pay

## 2020-04-27 DIAGNOSIS — Z01812 Encounter for preprocedural laboratory examination: Secondary | ICD-10-CM | POA: Insufficient documentation

## 2020-04-27 HISTORY — DX: Unspecified osteoarthritis, unspecified site: M19.90

## 2020-04-27 HISTORY — DX: Gastro-esophageal reflux disease without esophagitis: K21.9

## 2020-04-27 LAB — CBC
HCT: 38.9 % (ref 36.0–46.0)
Hemoglobin: 13.2 g/dL (ref 12.0–15.0)
MCH: 34.4 pg — ABNORMAL HIGH (ref 26.0–34.0)
MCHC: 33.9 g/dL (ref 30.0–36.0)
MCV: 101.3 fL — ABNORMAL HIGH (ref 80.0–100.0)
Platelets: 205 10*3/uL (ref 150–400)
RBC: 3.84 MIL/uL — ABNORMAL LOW (ref 3.87–5.11)
RDW: 13 % (ref 11.5–15.5)
WBC: 5.7 10*3/uL (ref 4.0–10.5)
nRBC: 0 % (ref 0.0–0.2)

## 2020-04-27 LAB — SURGICAL PCR SCREEN
MRSA, PCR: NEGATIVE
Staphylococcus aureus: NEGATIVE

## 2020-04-27 LAB — TYPE AND SCREEN
ABO/RH(D): A POS
Antibody Screen: NEGATIVE

## 2020-05-05 ENCOUNTER — Other Ambulatory Visit (HOSPITAL_COMMUNITY)
Admission: RE | Admit: 2020-05-05 | Discharge: 2020-05-05 | Disposition: A | Discharge status: Home / Self Care | Payer: 59 | Source: Ambulatory Visit | Attending: Orthopedic Surgery | Admitting: Orthopedic Surgery

## 2020-05-05 DIAGNOSIS — Z01812 Encounter for preprocedural laboratory examination: Secondary | ICD-10-CM | POA: Diagnosis present

## 2020-05-05 DIAGNOSIS — Z20822 Contact with and (suspected) exposure to covid-19: Secondary | ICD-10-CM | POA: Diagnosis not present

## 2020-05-05 LAB — SARS CORONAVIRUS 2 (TAT 6-24 HRS): SARS Coronavirus 2: NEGATIVE

## 2020-05-08 NOTE — Anesthesia Preprocedure Evaluation (Addendum)
Anesthesia Evaluation  Patient identified by MRN, date of birth, ID band Patient awake    Reviewed: Allergy & Precautions, H&P , NPO status , Patient's Chart, lab work & pertinent test results  Airway Mallampati: I  TM Distance: >3 FB Neck ROM: Full    Dental no notable dental hx. (+) Teeth Intact, Dental Advisory Given   Pulmonary neg pulmonary ROS,    Pulmonary exam normal breath sounds clear to auscultation       Cardiovascular Exercise Tolerance: Good negative cardio ROS Normal cardiovascular exam Rhythm:Regular Rate:Normal     Neuro/Psych PSYCHIATRIC DISORDERS Depression negative neurological ROS  negative psych ROS   GI/Hepatic negative GI ROS, Neg liver ROS, GERD  Medicated,  Endo/Other  negative endocrine ROS  Renal/GU negative Renal ROS  negative genitourinary   Musculoskeletal negative musculoskeletal ROS (+) Arthritis , Osteoarthritis,    Abdominal   Peds negative pediatric ROS (+)  Hematology negative hematology ROS (+)   Anesthesia Other Findings   Reproductive/Obstetrics negative OB ROS                            Anesthesia Physical Anesthesia Plan  ASA: II  Anesthesia Plan: Spinal and MAC   Post-op Pain Management:    Induction: Intravenous  PONV Risk Score and Plan: 3 and Propofol infusion  Airway Management Planned: Nasal Cannula, Natural Airway, Simple Face Mask and Mask  Additional Equipment:   Intra-op Plan:   Post-operative Plan:   Informed Consent: I have reviewed the patients History and Physical, chart, labs and discussed the procedure including the risks, benefits and alternatives for the proposed anesthesia with the patient or authorized representative who has indicated his/her understanding and acceptance.     Dental advisory given  Plan Discussed with: Anesthesiologist and CRNA  Anesthesia Plan Comments: (Discussed both nerve block for pain  relief post-op and GA; including NV, sore throat, dental injury, and pulmonary complications)        Anesthesia Quick Evaluation

## 2020-05-08 NOTE — H&P (Signed)
TOTAL HIP ADMISSION H&P  Patient is admitted for left total hip arthroplasty.  Subjective:  Chief Complaint: left hip pain  HPI: Yvette Ferguson, 64 y.o. female, has a history of pain and functional disability in the left hip(s) due to arthritis and patient has failed non-surgical conservative treatments for greater than 12 weeks to include NSAID's and/or analgesics and activity modification.  Onset of symptoms was gradual starting 2 years ago with gradually worsening course since that time.The patient noted no past surgery on the left hip(s).  Patient currently rates pain in the left hip at 8 out of 10 with activity. Patient has worsening of pain with activity and weight bearing and pain that interfers with activities of daily living. Patient has evidence of joint space narrowing by imaging studies. This condition presents safety issues increasing the risk of falls.  There is no current active infection.  Patient Active Problem List   Diagnosis Date Noted  . Menopause 05/13/2012  . Dyspepsia 05/13/2012  . Depression   . Herpes labialis   . Tremor   . Hyperlipidemia    Past Medical History:  Diagnosis Date  . Arthritis   . Depression   . GERD (gastroesophageal reflux disease)   . Herpes labialis   . Hyperlipidemia   . Tremor     Past Surgical History:  Procedure Laterality Date  . ABDOMINAL HYSTERECTOMY  2001  . BREAST SURGERY  2000  . eyelid surgery    . VAGINAL DELIVERY     x2    No current facility-administered medications for this encounter.   Current Outpatient Medications  Medication Sig Dispense Refill Last Dose  . ALPRAZolam (XANAX) 0.5 MG tablet Take 1 tablet (0.5 mg total) by mouth at bedtime as needed. for sleep (Patient taking differently: Take 0.5 mg by mouth at bedtime as needed for sleep. ) 30 tablet 0   . buPROPion (WELLBUTRIN XL) 150 MG 24 hr tablet Take 150 mg by mouth daily.     Marland Kitchen escitalopram (LEXAPRO) 10 MG tablet Take 10 mg by mouth daily.       Marland Kitchen ezetimibe (ZETIA) 10 MG tablet Take 1 tablet (10 mg total) by mouth daily. 90 tablet 3   . Multiple Vitamin (MULTIVITAMIN) tablet Take 1 tablet by mouth daily.     . valACYclovir (VALTREX) 1000 MG tablet TAKE 1 TABLET BY MOUTH EVERY DAY (Patient taking differently: Take 1,000 mg by mouth daily. ) 90 tablet 3   . ascorbic acid (VITAMIN C) 500 MG tablet Take 500 mg by mouth daily.      No Known Allergies  Social History   Tobacco Use  . Smoking status: Never Smoker  . Smokeless tobacco: Never Used  Substance Use Topics  . Alcohol use: Yes    Alcohol/week: 1.0 standard drink    Types: 1 Glasses of wine per week    Comment: One daily    Family History  Problem Relation Age of Onset  . Diabetes Mother   . Pancreatic cancer Mother   . Hypertension Mother   . Diabetes Father   . Leukemia Father   . Hypertension Father      Review of Systems  Constitutional: Negative for chills and fever.  Respiratory: Negative for cough and shortness of breath.   Cardiovascular: Negative for chest pain.  Gastrointestinal: Negative for nausea and vomiting.  Musculoskeletal: Positive for arthralgias.    Objective:  Physical Exam Patient is a 64 year old female.  Well nourished and well developed. General: Alert  and oriented x3, cooperative and pleasant, no acute distress. Head: normocephalic, atraumatic, neck supple. Eyes: EOMI. Respiratory: breath sounds clear in all fields, no wheezing, rales, or rhonchi. Cardiovascular: Regular rate and rhythm, no murmurs, gallops or rubs. Abdomen: non-tender to palpation and soft, normoactive bowel sounds.  Musculoskeletal: Left hip exam: Pain with passive hip flexion and internal rotation to 5 of pelvic tilting, external rotation to 20 with tightness Active hip flexion limited due to discomfort with slight external rotation Otherwise neurovascular intact  Calves soft and nontender. Motor function intact in LE. Strength 5/5 LE  bilaterally. Neuro: Distal pulses 2+. Sensation to light touch intact in LE.  Vital signs in last 24 hours:    Labs:   Estimated body mass index is 22.66 kg/m as calculated from the following:   Height as of 04/27/20: 5\' 4"  (1.626 m).   Weight as of 04/27/20: 59.9 kg.   Imaging Review Plain radiographs demonstrate severe degenerative joint disease of the left hip(s). The bone quality appears to be adequate for age and reported activity level.      Assessment/Plan:  End stage arthritis, left hip(s)  The patient history, physical examination, clinical judgement of the provider and imaging studies are consistent with end stage degenerative joint disease of the left hip(s) and total hip arthroplasty is deemed medically necessary. The treatment options including medical management, injection therapy, arthroscopy and arthroplasty were discussed at length. The risks and benefits of total hip arthroplasty were presented and reviewed. The risks due to aseptic loosening, infection, stiffness, dislocation/subluxation,  thromboembolic complications and other imponderables were discussed.  The patient acknowledged the explanation, agreed to proceed with the plan and consent was signed. Patient is being admitted for inpatient treatment for surgery, pain control, PT, OT, prophylactic antibiotics, VTE prophylaxis, progressive ambulation and ADL's and discharge planning.The patient is planning to be discharged home.    Therapy Plans: HEP Disposition: Home with daughter Planned DVT Prophylaxis: aspirin 81mg  BID DME needed: walker PCP: 04/29/20, PA-C clearance received TXA: IV Allergies: NKDA Anesthesia Concerns: none BMI: 22 Not diabetic.  Other: Scheduled as SDD. Hydrocodone is okay.   - Patient was instructed on what medications to stop prior to surgery. - Follow-up visit in 2 weeks with Dr. - Begin physical therapy following surgery - Pre-operative lab work as pre-surgical  testing - Prescriptions will be provided in hospital at time of discharge  Theora Gianotti, PA-C Orthopedic Surgery [facility] [place] [phone]

## 2020-05-09 ENCOUNTER — Ambulatory Visit (HOSPITAL_COMMUNITY): Payer: 59 | Admitting: Anesthesiology

## 2020-05-09 ENCOUNTER — Observation Stay (HOSPITAL_COMMUNITY)
Admission: RE | Admit: 2020-05-09 | Discharge: 2020-05-10 | Disposition: A | Discharge status: Home / Self Care | Payer: 59 | Attending: Orthopedic Surgery | Admitting: Orthopedic Surgery

## 2020-05-09 ENCOUNTER — Encounter (HOSPITAL_COMMUNITY): Payer: Self-pay | Admitting: Orthopedic Surgery

## 2020-05-09 ENCOUNTER — Ambulatory Visit (HOSPITAL_COMMUNITY): Payer: 59

## 2020-05-09 ENCOUNTER — Other Ambulatory Visit: Payer: Self-pay

## 2020-05-09 ENCOUNTER — Encounter (HOSPITAL_COMMUNITY)
Admission: RE | Disposition: A | Discharge status: Home / Self Care | Payer: Self-pay | Source: Home / Self Care | Attending: Orthopedic Surgery

## 2020-05-09 DIAGNOSIS — Z96642 Presence of left artificial hip joint: Secondary | ICD-10-CM

## 2020-05-09 DIAGNOSIS — Z419 Encounter for procedure for purposes other than remedying health state, unspecified: Secondary | ICD-10-CM

## 2020-05-09 DIAGNOSIS — Z96649 Presence of unspecified artificial hip joint: Secondary | ICD-10-CM

## 2020-05-09 DIAGNOSIS — M1612 Unilateral primary osteoarthritis, left hip: Secondary | ICD-10-CM | POA: Diagnosis not present

## 2020-05-09 DIAGNOSIS — M25552 Pain in left hip: Secondary | ICD-10-CM | POA: Diagnosis present

## 2020-05-09 HISTORY — PX: TOTAL HIP ARTHROPLASTY: SHX124

## 2020-05-09 LAB — ABO/RH: ABO/RH(D): A POS

## 2020-05-09 SURGERY — ARTHROPLASTY, HIP, TOTAL, ANTERIOR APPROACH
Anesthesia: Monitor Anesthesia Care | Site: Hip | Laterality: Left

## 2020-05-09 MED ORDER — METHOCARBAMOL 500 MG IVPB - SIMPLE MED
500.0000 mg | Freq: Four times a day (QID) | INTRAVENOUS | Status: DC | PRN
  Filled 2020-05-09: qty 50

## 2020-05-09 MED ORDER — CHLORHEXIDINE GLUCONATE 0.12 % MT SOLN
15.0000 mL | Freq: Once | OROMUCOSAL | Status: AC
  Administered 2020-05-09: 15 mL via OROMUCOSAL

## 2020-05-09 MED ORDER — MIDAZOLAM HCL 5 MG/5ML IJ SOLN
INTRAMUSCULAR | Status: DC | PRN
  Administered 2020-05-09 (×2): 1 mg via INTRAVENOUS

## 2020-05-09 MED ORDER — OXYCODONE HCL 5 MG PO TABS
ORAL_TABLET | ORAL | Status: AC
  Filled 2020-05-09: qty 1

## 2020-05-09 MED ORDER — BUPROPION HCL ER (XL) 150 MG PO TB24
150.0000 mg | ORAL_TABLET | Freq: Every day | ORAL | Status: DC
  Administered 2020-05-09: 150 mg via ORAL
  Filled 2020-05-09: qty 1

## 2020-05-09 MED ORDER — DOCUSATE SODIUM 100 MG PO CAPS
100.0000 mg | ORAL_CAPSULE | Freq: Two times a day (BID) | ORAL | Status: DC
  Administered 2020-05-09 – 2020-05-10 (×2): 100 mg via ORAL
  Filled 2020-05-09: qty 1

## 2020-05-09 MED ORDER — ACETAMINOPHEN 325 MG PO TABS: 325.0000 mg | ORAL_TABLET | ORAL | Status: DC | PRN

## 2020-05-09 MED ORDER — ALBUMIN HUMAN 5 % IV SOLN: INTRAVENOUS | Status: DC | PRN

## 2020-05-09 MED ORDER — FERROUS SULFATE 325 (65 FE) MG PO TABS
325.0000 mg | ORAL_TABLET | Freq: Three times a day (TID) | ORAL | Status: DC
  Administered 2020-05-09 – 2020-05-10 (×2): 325 mg via ORAL
  Filled 2020-05-09 (×2): qty 1

## 2020-05-09 MED ORDER — MAGNESIUM CITRATE PO SOLN: 1.0000 | Freq: Once | ORAL | Status: DC | PRN

## 2020-05-09 MED ORDER — EPHEDRINE SULFATE 50 MG/ML IJ SOLN
INTRAMUSCULAR | Status: DC | PRN
  Administered 2020-05-09: 10 mg via INTRAVENOUS
  Administered 2020-05-09: 5 mg via INTRAVENOUS

## 2020-05-09 MED ORDER — METOCLOPRAMIDE HCL 5 MG PO TABS: 5.0000 mg | ORAL_TABLET | Freq: Three times a day (TID) | ORAL | Status: DC | PRN

## 2020-05-09 MED ORDER — LACTATED RINGERS IV BOLUS
250.0000 mL | Freq: Once | INTRAVENOUS | Status: AC
  Administered 2020-05-09: 250 mL via INTRAVENOUS

## 2020-05-09 MED ORDER — ALPRAZOLAM 0.5 MG PO TABS
0.5000 mg | ORAL_TABLET | Freq: Every evening | ORAL | Status: DC | PRN
  Administered 2020-05-09: 0.5 mg via ORAL
  Filled 2020-05-09: qty 1

## 2020-05-09 MED ORDER — ALUM & MAG HYDROXIDE-SIMETH 200-200-20 MG/5ML PO SUSP: 15.0000 mL | ORAL | Status: DC | PRN

## 2020-05-09 MED ORDER — LACTATED RINGERS IV SOLN: INTRAVENOUS | Status: DC

## 2020-05-09 MED ORDER — MEPERIDINE HCL 50 MG/ML IJ SOLN: 6.2500 mg | INTRAMUSCULAR | Status: DC | PRN

## 2020-05-09 MED ORDER — PHENOL 1.4 % MT LIQD: 1.0000 | OROMUCOSAL | Status: DC | PRN

## 2020-05-09 MED ORDER — FENTANYL CITRATE (PF) 100 MCG/2ML IJ SOLN
INTRAMUSCULAR | Status: AC
  Filled 2020-05-09: qty 2

## 2020-05-09 MED ORDER — CEFAZOLIN SODIUM-DEXTROSE 2-4 GM/100ML-% IV SOLN
2.0000 g | INTRAVENOUS | Status: AC
  Administered 2020-05-09: 2 g via INTRAVENOUS
  Filled 2020-05-09: qty 100

## 2020-05-09 MED ORDER — EPHEDRINE 5 MG/ML INJ
INTRAVENOUS | Status: AC
  Filled 2020-05-09: qty 10

## 2020-05-09 MED ORDER — VALACYCLOVIR HCL 500 MG PO TABS
1000.0000 mg | ORAL_TABLET | Freq: Every day | ORAL | Status: DC
  Filled 2020-05-09 (×3): qty 2

## 2020-05-09 MED ORDER — TRANEXAMIC ACID-NACL 1000-0.7 MG/100ML-% IV SOLN
1000.0000 mg | INTRAVENOUS | Status: AC
  Administered 2020-05-09: 1000 mg via INTRAVENOUS
  Filled 2020-05-09: qty 100

## 2020-05-09 MED ORDER — CEFAZOLIN SODIUM-DEXTROSE 2-4 GM/100ML-% IV SOLN
2.0000 g | Freq: Four times a day (QID) | INTRAVENOUS | Status: AC
  Administered 2020-05-09: 2 g via INTRAVENOUS
  Filled 2020-05-09: qty 100

## 2020-05-09 MED ORDER — OXYCODONE HCL 5 MG/5ML PO SOLN: 5.0000 mg | Freq: Once | ORAL | Status: AC | PRN

## 2020-05-09 MED ORDER — METOCLOPRAMIDE HCL 5 MG/ML IJ SOLN: 5.0000 mg | Freq: Three times a day (TID) | INTRAMUSCULAR | Status: DC | PRN

## 2020-05-09 MED ORDER — ACETAMINOPHEN 325 MG PO TABS
325.0000 mg | ORAL_TABLET | Freq: Four times a day (QID) | ORAL | Status: DC | PRN
  Administered 2020-05-10: 325 mg via ORAL
  Filled 2020-05-09: qty 1

## 2020-05-09 MED ORDER — SODIUM CHLORIDE 0.9 % IR SOLN
Status: DC | PRN
  Administered 2020-05-09: 1000 mL

## 2020-05-09 MED ORDER — ONDANSETRON HCL 4 MG/2ML IJ SOLN: 4.0000 mg | Freq: Once | INTRAMUSCULAR | Status: DC | PRN

## 2020-05-09 MED ORDER — DIPHENHYDRAMINE HCL 12.5 MG/5ML PO ELIX: 12.5000 mg | ORAL_SOLUTION | ORAL | Status: DC | PRN

## 2020-05-09 MED ORDER — ACETAMINOPHEN 160 MG/5ML PO SOLN: 325.0000 mg | ORAL | Status: DC | PRN

## 2020-05-09 MED ORDER — DEXAMETHASONE SODIUM PHOSPHATE 10 MG/ML IJ SOLN
INTRAMUSCULAR | Status: AC
  Filled 2020-05-09: qty 1

## 2020-05-09 MED ORDER — HYDROCODONE-ACETAMINOPHEN 5-325 MG PO TABS
1.0000 | ORAL_TABLET | ORAL | Status: DC | PRN
  Administered 2020-05-09 – 2020-05-10 (×3): 1 via ORAL
  Filled 2020-05-09 (×3): qty 1

## 2020-05-09 MED ORDER — LIDOCAINE HCL (CARDIAC) PF 100 MG/5ML IV SOSY
PREFILLED_SYRINGE | INTRAVENOUS | Status: DC | PRN
  Administered 2020-05-09: 50 mg via INTRAVENOUS

## 2020-05-09 MED ORDER — BUPIVACAINE IN DEXTROSE 0.75-8.25 % IT SOLN
INTRATHECAL | Status: DC | PRN
  Administered 2020-05-09: 14 mg via INTRATHECAL

## 2020-05-09 MED ORDER — LACTATED RINGERS IV BOLUS
500.0000 mL | Freq: Once | INTRAVENOUS | Status: AC
  Administered 2020-05-09: 500 mL via INTRAVENOUS

## 2020-05-09 MED ORDER — LIDOCAINE HCL (PF) 2 % IJ SOLN
INTRAMUSCULAR | Status: AC
  Filled 2020-05-09: qty 5

## 2020-05-09 MED ORDER — DEXAMETHASONE SODIUM PHOSPHATE 10 MG/ML IJ SOLN
10.0000 mg | Freq: Once | INTRAMUSCULAR | Status: AC
  Administered 2020-05-10: 10 mg via INTRAVENOUS
  Filled 2020-05-09: qty 1

## 2020-05-09 MED ORDER — POLYETHYLENE GLYCOL 3350 17 G PO PACK
17.0000 g | PACK | Freq: Two times a day (BID) | ORAL | Status: DC
  Administered 2020-05-09: 17 g via ORAL
  Filled 2020-05-09: qty 1

## 2020-05-09 MED ORDER — ESCITALOPRAM OXALATE 10 MG PO TABS
10.0000 mg | ORAL_TABLET | Freq: Every day | ORAL | Status: DC
  Administered 2020-05-09: 10 mg via ORAL
  Filled 2020-05-09: qty 1

## 2020-05-09 MED ORDER — ALBUMIN HUMAN 5 % IV SOLN
INTRAVENOUS | Status: AC
  Filled 2020-05-09: qty 250

## 2020-05-09 MED ORDER — ORAL CARE MOUTH RINSE: 15.0000 mL | Freq: Once | OROMUCOSAL | Status: AC

## 2020-05-09 MED ORDER — ONDANSETRON HCL 4 MG PO TABS: 4.0000 mg | ORAL_TABLET | Freq: Four times a day (QID) | ORAL | Status: DC | PRN

## 2020-05-09 MED ORDER — STERILE WATER FOR IRRIGATION IR SOLN
Status: DC | PRN
  Administered 2020-05-09: 2000 mL

## 2020-05-09 MED ORDER — SODIUM CHLORIDE 0.9 % IV SOLN: INTRAVENOUS | Status: DC

## 2020-05-09 MED ORDER — TRANEXAMIC ACID-NACL 1000-0.7 MG/100ML-% IV SOLN: 1000.0000 mg | Freq: Once | INTRAVENOUS | Status: DC

## 2020-05-09 MED ORDER — PROPOFOL 1000 MG/100ML IV EMUL
INTRAVENOUS | Status: AC
  Filled 2020-05-09: qty 100

## 2020-05-09 MED ORDER — HYDROCODONE-ACETAMINOPHEN 7.5-325 MG PO TABS
1.0000 | ORAL_TABLET | ORAL | Status: DC | PRN
  Administered 2020-05-10: 1 via ORAL
  Filled 2020-05-09: qty 1

## 2020-05-09 MED ORDER — ONDANSETRON HCL 4 MG/2ML IJ SOLN
INTRAMUSCULAR | Status: DC | PRN
  Administered 2020-05-09: 4 mg via INTRAVENOUS

## 2020-05-09 MED ORDER — ASPIRIN 81 MG PO CHEW
81.0000 mg | CHEWABLE_TABLET | Freq: Two times a day (BID) | ORAL | Status: DC
  Administered 2020-05-09 – 2020-05-10 (×2): 81 mg via ORAL
  Filled 2020-05-09 (×2): qty 1

## 2020-05-09 MED ORDER — METHOCARBAMOL 500 MG PO TABS
500.0000 mg | ORAL_TABLET | Freq: Four times a day (QID) | ORAL | Status: DC | PRN
  Administered 2020-05-09: 500 mg via ORAL
  Filled 2020-05-09: qty 1

## 2020-05-09 MED ORDER — FENTANYL CITRATE (PF) 100 MCG/2ML IJ SOLN: 25.0000 ug | INTRAMUSCULAR | Status: DC | PRN

## 2020-05-09 MED ORDER — ONDANSETRON HCL 4 MG/2ML IJ SOLN
INTRAMUSCULAR | Status: AC
  Filled 2020-05-09: qty 2

## 2020-05-09 MED ORDER — ONDANSETRON HCL 4 MG/2ML IJ SOLN: 4.0000 mg | Freq: Four times a day (QID) | INTRAMUSCULAR | Status: DC | PRN

## 2020-05-09 MED ORDER — MIDAZOLAM HCL 2 MG/2ML IJ SOLN
INTRAMUSCULAR | Status: AC
  Filled 2020-05-09: qty 2

## 2020-05-09 MED ORDER — EZETIMIBE 10 MG PO TABS
10.0000 mg | ORAL_TABLET | Freq: Every day | ORAL | Status: DC
  Administered 2020-05-09: 10 mg via ORAL
  Filled 2020-05-09: qty 1

## 2020-05-09 MED ORDER — CEFAZOLIN SODIUM-DEXTROSE 2-4 GM/100ML-% IV SOLN
INTRAVENOUS | Status: AC
  Administered 2020-05-09: 2000 mg
  Filled 2020-05-09: qty 100

## 2020-05-09 MED ORDER — PROPOFOL 500 MG/50ML IV EMUL
INTRAVENOUS | Status: DC | PRN
  Administered 2020-05-09: 50 ug/kg/min via INTRAVENOUS

## 2020-05-09 MED ORDER — MENTHOL 3 MG MT LOZG: 1.0000 | LOZENGE | OROMUCOSAL | Status: DC | PRN

## 2020-05-09 MED ORDER — OXYCODONE HCL 5 MG PO TABS
5.0000 mg | ORAL_TABLET | Freq: Once | ORAL | Status: AC | PRN
  Administered 2020-05-09: 5 mg via ORAL

## 2020-05-09 MED ORDER — MORPHINE SULFATE (PF) 2 MG/ML IV SOLN
0.5000 mg | INTRAVENOUS | Status: DC | PRN
  Administered 2020-05-09: 1 mg via INTRAVENOUS
  Filled 2020-05-09: qty 1

## 2020-05-09 MED ORDER — FENTANYL CITRATE (PF) 100 MCG/2ML IJ SOLN
INTRAMUSCULAR | Status: DC | PRN
  Administered 2020-05-09: 50 ug via INTRAVENOUS

## 2020-05-09 MED ORDER — DEXAMETHASONE SODIUM PHOSPHATE 10 MG/ML IJ SOLN
10.0000 mg | Freq: Once | INTRAMUSCULAR | Status: AC
  Administered 2020-05-09: 10 mg via INTRAVENOUS

## 2020-05-09 MED ORDER — BISACODYL 10 MG RE SUPP: 10.0000 mg | Freq: Every day | RECTAL | Status: DC | PRN

## 2020-05-09 SURGICAL SUPPLY — 46 items
ADH SKN CLS APL DERMABOND .7 (GAUZE/BANDAGES/DRESSINGS) ×1
BAG DECANTER FOR FLEXI CONT (MISCELLANEOUS) IMPLANT
BAG SPEC THK2 15X12 ZIP CLS (MISCELLANEOUS)
BAG ZIPLOCK 12X15 (MISCELLANEOUS) IMPLANT
BLADE SAG 18X100X1.27 (BLADE) ×2 IMPLANT
BLADE SURG SZ10 CARB STEEL (BLADE) ×2 IMPLANT
COVER PERINEAL POST (MISCELLANEOUS) ×2 IMPLANT
COVER SURGICAL LIGHT HANDLE (MISCELLANEOUS) ×2 IMPLANT
COVER WAND RF STERILE (DRAPES) ×1 IMPLANT
CUP ACETBLR 52 OD PINNACLE (Hips) ×1 IMPLANT
DERMABOND ADVANCED (GAUZE/BANDAGES/DRESSINGS) ×1
DERMABOND ADVANCED .7 DNX12 (GAUZE/BANDAGES/DRESSINGS) ×1 IMPLANT
DRAPE STERI IOBAN 125X83 (DRAPES) ×2 IMPLANT
DRAPE U-SHAPE 47X51 STRL (DRAPES) ×4 IMPLANT
DRESSING AQUACEL AG SP 3.5X10 (GAUZE/BANDAGES/DRESSINGS) ×1 IMPLANT
DRSG AQUACEL AG SP 3.5X10 (GAUZE/BANDAGES/DRESSINGS) ×2
DURAPREP 26ML APPLICATOR (WOUND CARE) ×2 IMPLANT
ELECT REM PT RETURN 15FT ADLT (MISCELLANEOUS) ×2 IMPLANT
ELIMINATOR HOLE APEX DEPUY (Hips) ×1 IMPLANT
GLOVE BIO SURGEON STRL SZ 6 (GLOVE) ×4 IMPLANT
GLOVE BIOGEL PI IND STRL 6.5 (GLOVE) ×1 IMPLANT
GLOVE BIOGEL PI IND STRL 7.5 (GLOVE) ×1 IMPLANT
GLOVE BIOGEL PI IND STRL 8.5 (GLOVE) ×1 IMPLANT
GLOVE BIOGEL PI INDICATOR 6.5 (GLOVE) ×1
GLOVE BIOGEL PI INDICATOR 7.5 (GLOVE) ×1
GLOVE BIOGEL PI INDICATOR 8.5 (GLOVE)
GLOVE ECLIPSE 8.0 STRL XLNG CF (GLOVE) ×2 IMPLANT
GLOVE ORTHO TXT STRL SZ7.5 (GLOVE) ×4 IMPLANT
GOWN STRL REUS W/TWL LRG LVL3 (GOWN DISPOSABLE) ×4 IMPLANT
GOWN STRL REUS W/TWL XL LVL3 (GOWN DISPOSABLE) ×2 IMPLANT
HEAD CERAMIC DELTA 36 PLUS 1.5 (Hips) ×1 IMPLANT
HOLDER FOLEY CATH W/STRAP (MISCELLANEOUS) ×2 IMPLANT
KIT TURNOVER KIT A (KITS) IMPLANT
LINER NEUTRAL 52X36MM PLUS 4 (Liner) ×1 IMPLANT
PACK ANTERIOR HIP CUSTOM (KITS) ×2 IMPLANT
PENCIL SMOKE EVACUATOR (MISCELLANEOUS) ×1 IMPLANT
SCREW 6.5MMX30MM (Screw) ×1 IMPLANT
STEM FEMORAL SZ5 HIGH ACTIS (Stem) ×1 IMPLANT
SUT MNCRL AB 4-0 PS2 18 (SUTURE) ×2 IMPLANT
SUT STRATAFIX 0 PDS 27 VIOLET (SUTURE) ×2
SUT VIC AB 1 CT1 36 (SUTURE) ×6 IMPLANT
SUT VIC AB 2-0 CT1 27 (SUTURE) ×4
SUT VIC AB 2-0 CT1 TAPERPNT 27 (SUTURE) ×2 IMPLANT
SUTURE STRATFX 0 PDS 27 VIOLET (SUTURE) ×1 IMPLANT
TRAY FOLEY MTR SLVR 14FR STAT (SET/KITS/TRAYS/PACK) ×2 IMPLANT
WATER STERILE IRR 1000ML POUR (IV SOLUTION) ×3 IMPLANT

## 2020-05-09 NOTE — Transfer of Care (Signed)
Immediate Anesthesia Transfer of Care Note  Patient: Yvette Ferguson  Procedure(s) Performed: TOTAL HIP ARTHROPLASTY ANTERIOR APPROACH (Left Hip)  Patient Location: PACU and ICU  Anesthesia Type:Spinal  Level of Consciousness: awake, alert , oriented and patient cooperative  Airway & Oxygen Therapy: Patient Spontanous Breathing and Patient connected to face mask oxygen  Post-op Assessment: Report given to RN and Post -op Vital signs reviewed and stable  Post vital signs: Reviewed and stable  Last Vitals:  Vitals Value Taken Time  BP    Temp    Pulse    Resp    SpO2      Last Pain:  Vitals:   05/09/20 0649  TempSrc:   PainSc: 0-No pain         Complications: No complications documented.

## 2020-05-09 NOTE — Op Note (Signed)
NAME:  Yvette Ferguson NO.: [id number]      MEDICAL RECORD NO.: [id number]      FACILITY:  [facility]      PHYSICIAN:  [name]  DATE OF BIRTH:  1955/11/10     DATE OF PROCEDURE:  05/09/2020                                 OPERATIVE REPORT         PREOPERATIVE DIAGNOSIS: Left  hip osteoarthritis.      POSTOPERATIVE DIAGNOSIS:  Left hip osteoarthritis.      PROCEDURE:  Left total hip replacement through an anterior approach   utilizing DePuy THR system, component size 52 mm pinnacle cup, a size 36+4 neutral   Altrex liner, a size 5 Hi Actis stem with a 36+1.5 delta ceramic   ball.      SURGEON:  Madlyn Frankel. Charlann Boxer, M.D.      ASSISTANT:  Dennie Bible, PA-C     ANESTHESIA:  Spinal.      SPECIMENS:  None.      COMPLICATIONS:  None.      BLOOD LOSS:  550 cc     DRAINS:  None.      INDICATION OF THE PROCEDURE:  Yvette Ferguson is a 64 y.o. female who had   presented to office for evaluation of left hip pain.  Radiographs revealed   progressive degenerative changes with bone-on-bone   articulation of the  hip joint, including subchondral cystic changes and osteophytes.  The patient had painful limited range of   motion significantly affecting their overall quality of life and function.  The patient was failing to    respond to conservative measures including medications and/or injections and activity modification and at this point was ready   to proceed with more definitive measures.  Consent was obtained for   benefit of pain relief.  Specific risks of infection, DVT, component   failure, dislocation, neurovascular injury, and need for revision surgery were reviewed in the office as well discussion of   the anterior versus posterior approach were reviewed.     PROCEDURE IN DETAIL:  The patient was brought to operative theater.   Once adequate anesthesia, preoperative antibiotics, 2 gm of Ancef, 1 gm of  Tranexamic Acid, and 10 mg of Decadron were administered, the patient was positioned supine on the [employer] table.  Once the patient was safely positioned with adequate padding of boney prominences we predraped out the hip, and used fluoroscopy to confirm orientation of the pelvis.      The left hip was then prepped and draped from proximal iliac crest to   mid thigh with a shower curtain technique.      Time-out was performed identifying the patient, planned procedure, and the appropriate extremity.     An incision was then made 2 cm lateral to the   anterior superior iliac spine extending over the orientation of the   tensor fascia lata muscle and sharp dissection was carried down to the   fascia of the muscle.      The fascia was then incised.  The muscle belly was identified and swept   laterally and retractor placed along the superior neck.  Following   cauterization of the circumflex vessels and removing some  pericapsular   fat, a second cobra retractor was placed on the inferior neck.  A T-capsulotomy was made along the line of the   superior neck to the trochanteric fossa, then extended proximally and   distally.  Tag sutures were placed and the retractors were then placed   intracapsular.  We then identified the trochanteric fossa and   orientation of my neck cut and then made a neck osteotomy with the femur on traction.  The femoral   head was removed without difficulty or complication.  Traction was let   off and retractors were placed posterior and anterior around the   acetabulum.      The labrum and foveal tissue were debrided.  I began reaming with a 44 mm   reamer and reamed up to 51 mm reamer with good bony bed preparation and a 52 mm  cup was chosen.  The final 52 mm Pinnacle cup was then impacted under fluoroscopy to confirm the depth of penetration and orientation with respect to   Abduction and forward flexion.  A screw was placed into the ilium followed by the hole  eliminator.  The final   36+4 neutral Altrex liner was impacted with good visualized rim fit.  The cup was positioned anatomically within the acetabular portion of the pelvis.      At this point, the femur was rolled to 100 degrees.  Further capsule was   released off the inferior aspect of the femoral neck.  I then   released the superior capsule proximally.  With the leg in a neutral position the hook was placed laterally   along the femur under the vastus lateralis origin and elevated manually and then held in position using the hook attachment on the bed.  The leg was then extended and adducted with the leg rolled to 100   degrees of external rotation.  Retractors were placed along the medial calcar and posteriorly over the greater trochanter.  Once the proximal femur was fully   exposed, I used a box osteotome to set orientation.  I then began   broaching with the starting chili pepper broach and passed this by hand and then broached up to 5.  With the 5 broach in place I chose a high offset neck and did several trial reductions.  The offset was appropriate, leg lengths   appeared to be equal best matched with the +1.5 head ball trial confirmed radiographically.   Given these findings, I went ahead and dislocated the hip, repositioned all   retractors and positioned the right hip in the extended and abducted position.  The final 5 Hi Actis stem was   chosen and it was impacted down to the level of neck cut.  Based on this   and the trial reductions, a final 36+1.5 delta ceramic ball was chosen and   impacted onto a clean and dry trunnion, and the hip was reduced.  The   hip had been irrigated throughout the case again at this point.  I did   reapproximate the superior capsular leaflet to the anterior leaflet   using #1 Vicryl.  The fascia of the   tensor fascia lata muscle was then reapproximated using #1 Vicryl and #0 Stratafix sutures.  The   remaining wound was closed with 2-0 Vicryl and  running 4-0 Monocryl.   The hip was cleaned, dried, and dressed sterilely using Dermabond and   Aquacel dressing.  The patient was then brought   to  recovery room in stable condition tolerating the procedure well.    Dennie Bible, PA-C was present for the entirety of the case involved from   preoperative positioning, perioperative retractor management, general   facilitation of the case, as well as primary wound closure as assistant.            Madlyn Frankel Charlann Boxer, M.D.        05/09/2020 9:37 AM

## 2020-05-09 NOTE — Evaluation (Signed)
Physical Therapy Evaluation Patient Details Name: Yvette Ferguson MRN: [id number] DOB: [birth year] Today's Date: 05/09/2020   History of Present Illness  Patient is 64 y.o. female s/p Lt THA anterior approach on 05/09/20 with PMH significant for HLD, GERD, depression, OA.     Clinical Impression  Cameron Fredrick is a 64 y.o. female POD 0 s/p Lt THA. Patient reports independence with mobility at baseline. Patient is now limited by functional impairments (see PT problem list below) and requires min assist for transfers and gait with RW. Patient was limited by hip weakness, suspect secondary to spinal block, and was limited to ~3 feet with RW and min assist to take small steps at EOB. Patient will benefit from continued skilled PT interventions to address impairments and progress towards PLOF. Acute PT will follow for additional session today to progress mobility and stair training in preparation for safe discharge home.     Follow Up Recommendations Follow surgeon's recommendation for DC plan and follow-up therapies    Equipment Recommendations  Rolling walker with 5" wheels    Recommendations for Other Services       Precautions / Restrictions Precautions Precautions: Fall Restrictions Weight Bearing Restrictions: No Other Position/Activity Restrictions: WBAT      Mobility  Bed Mobility Overal bed mobility: Needs Assistance Bed Mobility: Supine to Sit;Sit to Supine     Supine to sit: Min guard Sit to supine: Min guard   General bed mobility comments: no assist required. pt taking extra time to bring bil LE's on/off of bed.    Transfers Overall transfer level: Needs assistance Equipment used: Rolling walker (2 wheeled) Transfers: Sit to/from Stand Sit to Stand: Min assist         General transfer comment: cues for hand placement/technique. Min assist to steady in standing due to imbalance, suspect due to weak hip  extensors.  Ambulation/Gait Ambulation/Gait assistance: Min assist Gait Distance (Feet): 3 Feet Assistive device: Rolling walker (2 wheeled) Gait Pattern/deviations: Step-to pattern;Decreased stride length;Narrow base of support Gait velocity: decr   General Gait Details: pt required min assist to steady while taking several small steps forward and backward from EOB, assist to manage walker as well. pt limited by ongoing parasthesia and weakness at hips.  Stairs            Wheelchair Mobility    Modified Rankin (Stroke Patients Only)       Balance Overall balance assessment: Needs assistance Sitting-balance support: Feet supported Sitting balance-Leahy Scale: Good     Standing balance support: Bilateral upper extremity supported;During functional activity Standing balance-Leahy Scale: Poor                               Pertinent Vitals/Pain Pain Assessment: 0-10 Pain Score: 3  Pain Location: Lt hip Pain Descriptors / Indicators: Aching;Discomfort Pain Intervention(s): Limited activity within patient's tolerance;Monitored during session;Repositioned;Ice applied    Home Living Family/patient expects to be discharged to:: Private residence Living Arrangements: Alone Available Help at Discharge: Family Type of Home: House Home Access: Stairs to enter Entrance Stairs-Rails: None Secretary/administrator of Steps: 4 Home Layout: One level Home Equipment: Bedside commode;Cane - single point      Prior Function Level of Independence: Independent               Hand Dominance   Dominant Hand: Right    Extremity/Trunk Assessment   Upper Extremity Assessment Upper Extremity Assessment: Overall WFL for  tasks assessed    Lower Extremity Assessment Lower Extremity Assessment: Generalized weakness (bil weakness at glutes and 3/5 or less for plantar flexion)    Cervical / Trunk Assessment Cervical / Trunk Assessment: Normal  Communication    Communication: No difficulties  Cognition Arousal/Alertness: Awake/alert Behavior During Therapy: WFL for tasks assessed/performed Overall Cognitive Status: Within Functional Limits for tasks assessed                                        General Comments      Exercises     Assessment/Plan    PT Assessment Patient needs continued PT services  PT Problem List Decreased strength;Decreased range of motion;Decreased activity tolerance;Decreased balance;Decreased mobility;Decreased coordination;Decreased knowledge of use of DME;Decreased knowledge of precautions       PT Treatment Interventions DME instruction;Gait training;Stair training;Functional mobility training;Therapeutic activities;Therapeutic exercise;Balance training;Patient/family education    PT Goals (Current goals can be found in the Care Plan section)  Acute Rehab PT Goals Patient Stated Goal: return home today PT Goal Formulation: With patient Time For Goal Achievement: 05/16/20 Potential to Achieve Goals: Good    Frequency 7X/week   Barriers to discharge        Co-evaluation               AM-PAC PT "6 Clicks" Mobility  Outcome Measure Help needed turning from your back to your side while in a flat bed without using bedrails?: A Little Help needed moving from lying on your back to sitting on the side of a flat bed without using bedrails?: A Little Help needed moving to and from a bed to a chair (including a wheelchair)?: A Little Help needed standing up from a chair using your arms (e.g., wheelchair or bedside chair)?: A Little Help needed to walk in hospital room?: A Lot Help needed climbing 3-5 steps with a railing? : A Lot 6 Click Score: 16    End of Session Equipment Utilized During Treatment: Gait belt Activity Tolerance: Patient tolerated treatment well Patient left: in bed;with call bell/phone within reach Nurse Communication: Mobility status PT Visit Diagnosis: Other  abnormalities of gait and mobility (R26.89);Muscle weakness (generalized) (M62.81);Difficulty in walking, not elsewhere classified (R26.2)    Time: 0865-7846 PT Time Calculation (min) (ACUTE ONLY): 15 min   Charges:   PT Evaluation $PT Eval Low Complexity: 1 Low          Wynn Maudlin, DPT Acute Rehabilitation Services  Office [phone] Pager [phone]  05/09/2020 1:47 PM

## 2020-05-09 NOTE — Discharge Instructions (Signed)

## 2020-05-09 NOTE — Anesthesia Postprocedure Evaluation (Signed)
Anesthesia Post Note  Patient: Yvette Ferguson  Procedure(s) Performed: TOTAL HIP ARTHROPLASTY ANTERIOR APPROACH (Left Hip)     Patient location during evaluation: PACU Anesthesia Type: MAC Level of consciousness: oriented and awake and alert Pain management: pain level controlled Vital Signs Assessment: post-procedure vital signs reviewed and stable Respiratory status: spontaneous breathing, respiratory function stable and patient connected to nasal cannula oxygen Cardiovascular status: blood pressure returned to baseline and stable Postop Assessment: no headache, no backache and no apparent nausea or vomiting Anesthetic complications: no   No complications documented.  Last Vitals:  Vitals:   05/09/20 1200 05/09/20 1215  BP: 122/71   Pulse: 69 65  Resp:    Temp:    SpO2: 98% 99%    Last Pain:  Vitals:   05/09/20 1100  TempSrc:   PainSc: 0-No pain                 [name]

## 2020-05-09 NOTE — Interval H&P Note (Signed)
History and Physical Interval Note:  05/09/2020 7:01 AM  Yvette Ferguson  has presented today for surgery, with the diagnosis of Left hip osteoarthritis.  The various methods of treatment have been discussed with the patient and family. After consideration of risks, benefits and other options for treatment, the patient has consented to  Procedure(s) with comments: TOTAL HIP ARTHROPLASTY ANTERIOR APPROACH (Left) - 70 mins as a surgical intervention.  The patient's history has been reviewed, patient examined, no change in status, stable for surgery.  I have reviewed the patient's chart and labs.  Questions were answered to the patient's satisfaction.     [name]

## 2020-05-09 NOTE — Progress Notes (Signed)
Physical Therapy Treatment Patient Details Name: Yvette Ferguson MRN: [id number] DOB: Oct 21, 1955 Today's Date: 05/09/2020    History of Present Illness Patient is 64 y.o. female s/p Lt THA anterior approach on 05/09/20 with PMH significant for HLD, GERD, depression, OA.    PT Comments    Patient seen for follow up session today to advance mobility in effort for safe discharge home. Patient with good carryover on safe and placement with RW for sit<>stand from EOB. Patient ambulated ~40 feet to ambulate to bathroom and required min assist for sit<>stand from toilet. She c/o lightheadedness, dizziness, and overheated sensation in bathroom and seated rest provided in wheelchair. Patient provided seated rest and BP assessed, noted to be hypotensive (78/54 mmHg). Patient required min assist to return to supine in bed. She will benefit from continued skilled PT interventions to address impairments and progress towards PLOF. Acute PT will follow to progress mobility and stair training in preparation for safe discharge home.     Follow Up Recommendations  Follow surgeon's recommendation for DC plan and follow-up therapies     Equipment Recommendations  Rolling walker with 5" wheels    Recommendations for Other Services       Precautions / Restrictions Precautions Precautions: Fall Restrictions Weight Bearing Restrictions: No Other Position/Activity Restrictions: WBAT    Mobility  Bed Mobility Overal bed mobility: Needs Assistance Bed Mobility: Supine to Sit;Sit to Supine     Supine to sit: Min guard Sit to supine: Min assist   General bed mobility comments: cues for use of gait belt to assist Lt LE off EOB, pt required extra time due to pain. Assist provided to return to supine in bed and raise Lt LE.  Transfers Overall transfer level: Needs assistance Equipment used: Rolling walker (2 wheeled) Transfers: Sit to/from Stand Sit to Stand: Min assist;Min guard          General transfer comment: pt with good carryover for safe hand placement on RW, min guard for power up from EOB and min assist for rise from toilet and wheelchair.  Ambulation/Gait Ambulation/Gait assistance: Min assist;Min guard Gait Distance (Feet): 40 Feet Assistive device: Rolling walker (2 wheeled) Gait Pattern/deviations: Step-to pattern;Decreased stride length;Narrow base of support Gait velocity: decr   General Gait Details: Min guard with cues for safe step pattern and position of RW, min assist intermittently for safety with turning to sit in chair.   Stairs             Wheelchair Mobility    Modified Rankin (Stroke Patients Only)       Balance Overall balance assessment: Needs assistance Sitting-balance support: Feet supported Sitting balance-Leahy Scale: Good     Standing balance support: Bilateral upper extremity supported;During functional activity Standing balance-Leahy Scale: Poor                              Cognition Arousal/Alertness: Awake/alert Behavior During Therapy: WFL for tasks assessed/performed Overall Cognitive Status: Within Functional Limits for tasks assessed                                        Exercises      General Comments General comments (skin integrity, edema, etc.): pt c/o overheated sensation after sitting on commode and dizziness/lightheadedness after standing and ambulating short distance out of bathroom. Wheelchair provided for pt to sit  and pt's BP assessed. Noted to be 78/54 and then low 80's/60's on second reading. Pt returned to supine in bed and BP remained low at 90/71 mmHg.       Pertinent Vitals/Pain Pain Assessment: 0-10 Pain Score: 6  Pain Location: Lt hip Pain Descriptors / Indicators: Aching;Discomfort Pain Intervention(s): Limited activity within patient's tolerance;Premedicated before session;Monitored during session;Repositioned;Ice applied    Home Living  Family/patient expects to be discharged to:: Private residence Living Arrangements: Alone Available Help at Discharge: Family Type of Home: House Home Access: Stairs to enter Entrance Stairs-Rails: None Home Layout: One level Home Equipment: Bedside commode;Cane - single point      Prior Function Level of Independence: Independent          PT Goals (current goals can now be found in the care plan section) Acute Rehab PT Goals Patient Stated Goal: return home today PT Goal Formulation: With patient Time For Goal Achievement: 05/16/20 Potential to Achieve Goals: Good Progress towards PT goals: Progressing toward goals    Frequency    7X/week      PT Plan Current plan remains appropriate    Co-evaluation              AM-PAC PT "6 Clicks" Mobility   Outcome Measure  Help needed turning from your back to your side while in a flat bed without using bedrails?: A Little Help needed moving from lying on your back to sitting on the side of a flat bed without using bedrails?: A Little Help needed moving to and from a bed to a chair (including a wheelchair)?: A Little Help needed standing up from a chair using your arms (e.g., wheelchair or bedside chair)?: A Little Help needed to walk in hospital room?: A Little Help needed climbing 3-5 steps with a railing? : A Lot 6 Click Score: 17    End of Session Equipment Utilized During Treatment: Gait belt Activity Tolerance: Patient tolerated treatment well Patient left: in bed;with call bell/phone within reach Nurse Communication: Mobility status PT Visit Diagnosis: Other abnormalities of gait and mobility (R26.89);Muscle weakness (generalized) (M62.81);Difficulty in walking, not elsewhere classified (R26.2)     Time: 9563-8756 PT Time Calculation (min) (ACUTE ONLY): 26 min  Charges:  $Gait Training: 8-22 mins $Therapeutic Activity: 8-22 mins                    Wynn Maudlin, DPT Acute Rehabilitation Services  Office  [phone] Pager [phone]  05/09/2020 3:46 PM

## 2020-05-09 NOTE — Anesthesia Procedure Notes (Signed)
Spinal  Patient location during procedure: OR Staffing Performed: resident/CRNA  Resident/CRNA: Garth Bigness, CRNA Preanesthetic Checklist Completed: patient identified, IV checked, site marked, risks and benefits discussed, surgical consent, monitors and equipment checked, pre-op evaluation and timeout performed Spinal Block Patient position: sitting Prep: DuraPrep Patient monitoring: heart rate, cardiac monitor, continuous pulse ox and blood pressure Approach: midline Location: L3-4 Injection technique: single-shot Needle Needle type: Sprotte and Pencan  Needle gauge: 24 G Needle length: 9 cm Needle insertion depth: 4 cm Assessment Sensory level: T4

## 2020-05-10 ENCOUNTER — Encounter (HOSPITAL_COMMUNITY): Payer: Self-pay | Admitting: Orthopedic Surgery

## 2020-05-10 DIAGNOSIS — M1612 Unilateral primary osteoarthritis, left hip: Secondary | ICD-10-CM | POA: Diagnosis not present

## 2020-05-10 LAB — CBC
HCT: 27.8 % — ABNORMAL LOW (ref 36.0–46.0)
Hemoglobin: 9.5 g/dL — ABNORMAL LOW (ref 12.0–15.0)
MCH: 33.9 pg (ref 26.0–34.0)
MCHC: 34.2 g/dL (ref 30.0–36.0)
MCV: 99.3 fL (ref 80.0–100.0)
Platelets: 165 10*3/uL (ref 150–400)
RBC: 2.8 MIL/uL — ABNORMAL LOW (ref 3.87–5.11)
RDW: 12.8 % (ref 11.5–15.5)
WBC: 10.2 10*3/uL (ref 4.0–10.5)
nRBC: 0 % (ref 0.0–0.2)

## 2020-05-10 LAB — BASIC METABOLIC PANEL
Anion gap: 8 (ref 5–15)
BUN: 13 mg/dL (ref 8–23)
CO2: 26 mmol/L (ref 22–32)
Calcium: 8.3 mg/dL — ABNORMAL LOW (ref 8.9–10.3)
Chloride: 104 mmol/L (ref 98–111)
Creatinine, Ser: 0.68 mg/dL (ref 0.44–1.00)
GFR, Estimated: >60 mL/min (ref >60)
Glucose, Bld: 151 mg/dL — ABNORMAL HIGH (ref 70–99)
Potassium: 3.5 mmol/L (ref 3.5–5.1)
Sodium: 138 mmol/L (ref 135–145)

## 2020-05-10 MED ORDER — SODIUM CHLORIDE 0.9 % IV BOLUS
250.0000 mL | Freq: Once | INTRAVENOUS | Status: AC
  Administered 2020-05-10: 250 mL via INTRAVENOUS

## 2020-05-10 NOTE — Progress Notes (Signed)
   Subjective: 1 Day Post-Op Procedure(s) (LRB): TOTAL HIP ARTHROPLASTY ANTERIOR APPROACH (Left) Patient reports pain as mild.   Patient seen in rounds by Dr. Charlann Boxer. Patient is well, and has had no acute complaints or problems other than discomfort in the left hip. She ended up staying overnight due to lightheadedness/hypotension with therapy yesterday. No acute events overnight. Denies CP, SHOB, N/V.  We will continue therapy today.   Objective: Vital signs in last 24 hours: Temp:  [97.6 F (36.4 C)-100 F (37.8 C)] 100 F (37.8 C) (12/01 0617) Pulse Rate:  [59-88] 88 (12/01 0617) Resp:  [12-19] 16 (12/01 0617) BP: (109-134)/(65-84) 109/66 (12/01 0617) SpO2:  [97 %-100 %] 98 % (12/01 0617) Weight:  [63.7 kg] 63.7 kg (11/30 1603)  Intake/Output from previous day:  Intake/Output Summary (Last 24 hours) at 05/10/2020 0749 Last data filed at 05/09/2020 2045 Gross per 24 hour  Intake 2170 ml  Output 2200 ml  Net -30 ml     Intake/Output this shift: No intake/output data recorded.  Labs: Recent Labs    05/10/20 0406  HGB 9.5*   Recent Labs    05/10/20 0406  WBC 10.2  RBC 2.80*  HCT 27.8*  PLT 165   Recent Labs    05/10/20 0406  NA 138  K 3.5  CL 104  CO2 26  BUN 13  CREATININE 0.68  GLUCOSE 151*  CALCIUM 8.3*   No results for input(s): LABPT, INR in the last 72 hours.  Exam: General - Patient is Alert and Oriented Extremity - Neurologically intact Sensation intact distally Intact pulses distally Dorsiflexion/Plantar flexion intact Dressing - dressing C/D/I Motor Function - intact, moving foot and toes well on exam.   Past Medical History:  Diagnosis Date  . Arthritis   . Depression   . GERD (gastroesophageal reflux disease)   . Herpes labialis   . Hyperlipidemia   . Tremor     Assessment/Plan: 1 Day Post-Op Procedure(s) (LRB): TOTAL HIP ARTHROPLASTY ANTERIOR APPROACH (Left) Active Problems:   S/P left THA, AA   S/P hip replacement,  left  Estimated body mass index is 24.11 kg/m as calculated from the following:   Height as of this encounter: 5\' 4"  (1.626 m).   Weight as of this encounter: 63.7 kg. Advance diet Up with therapy  DVT Prophylaxis - Aspirin Weight bearing as tolerated.  Hemoglobin 9.5 this AM secondary to ABLA. She has been slightly hypotensive and lightheaded with PT, will give bolus today.   Plan is to go Home after hospital stay. Plan for discharge today following 1-2 sessions of therapy as long as she is meeting her goals. Follow up in the office in 2 weeks.   , PA-C Orthopedic Surgery [phone] 05/10/2020, 7:49 AM

## 2020-05-10 NOTE — TOC Transition Note (Signed)
Transition of Care [facility]) - CM/SW Discharge Note   Patient Details  Name: Yvette Ferguson MRN: [id number] Date of Birth: February 14, 1956  Transition of Care [facility]) CM/SW Contact:  Clearance Coots, LCSW Phone Number: 05/10/2020, 1:40 PM   Clinical Narrative:    Therapy Plan: HEP Patient confirm she has a 3in1. RW ordered though [employer] and delivered to the patient bedside.    Final next level of care: Home/Self Care (HEP) Barriers to Discharge: Barriers Resolved   Patient Goals and CMS Choice        Discharge Placement                       Discharge Plan and Services                DME Arranged: Walker rolling DME Agency: [employer] Date DME Agency Contacted: 05/10/20 Time DME Agency Contacted: [phone] Representative spoke with at DME Agency: Velna Hatchet            Social Determinants of Health (SDOH) Interventions     Readmission Risk Interventions No flowsheet data found.

## 2020-05-10 NOTE — Progress Notes (Signed)
Physical Therapy Treatment Patient Details Name: Yvette Ferguson MRN: [id number] DOB: [birth year] Today's Date: 05/10/2020    History of Present Illness Patient is 64 y.o. female s/p Lt THA anterior approach on 05/09/20 with PMH significant for HLD, GERD, depression, OA.    PT Comments    Progressing well with mobility. No issues with dizziness on today. Reviewed/practiced exercises, gait training, and stair training. Issued HEP for pt to perform 1-2x/day as tolerated. All education completed. Okay to d/c from PT standpoint.     Follow Up Recommendations  Follow surgeon's recommendation for DC plan and follow-up therapies (plan is HEP)     Equipment Recommendations  Rolling walker with 5" wheels    Recommendations for Other Services       Precautions / Restrictions Precautions Precautions: Fall Restrictions Weight Bearing Restrictions: No Other Position/Activity Restrictions: WBAT    Mobility  Bed Mobility Overal bed mobility: Needs Assistance Bed Mobility: Supine to Sit     Supine to sit: Supervision;HOB elevated        Transfers Overall transfer level: Needs assistance Equipment used: Rolling walker (2 wheeled) Transfers: Sit to/from Stand Sit to Stand: Supervision         General transfer comment: Cues for safety, hand placement.  Ambulation/Gait Ambulation/Gait assistance: Min guard Gait Distance (Feet): 100 Feet Assistive device: Rolling walker (2 wheeled) Gait Pattern/deviations: Step-through pattern;Decreased stride length     General Gait Details: Min guard for safety. Pt tolerated distance well. No LOB   Stairs             Wheelchair Mobility    Modified Rankin (Stroke Patients Only)       Balance Overall balance assessment: Needs assistance         Standing balance support: Bilateral upper extremity supported Standing balance-Leahy Scale: Fair                              Cognition Arousal/Alertness:  Awake/alert Behavior During Therapy: WFL for tasks assessed/performed Overall Cognitive Status: Within Functional Limits for tasks assessed                                        Exercises Total Joint Exercises Ankle Circles/Pumps: AROM;Both;10 reps Quad Sets: AROM;Both;10 reps Heel Slides: AAROM;Left;10 reps Hip ABduction/ADduction: AAROM;Left;10 reps (Active, standing, 10) Long Arc Quad: AROM;Left;10 reps Knee Flexion: AROM;Left;10 reps Marching in Standing: AROM;Both;10 reps General Exercises - Lower Extremity Heel Raises: AROM;Both;10 reps    General Comments        Pertinent Vitals/Pain Pain Assessment: 0-10 Pain Score: 5  Pain Location: L hip Pain Descriptors / Indicators: Discomfort;Sore Pain Intervention(s): Monitored during session;Repositioned;Ice applied    Home Living                      Prior Function            PT Goals (current goals can now be found in the care plan section) Progress towards PT goals: Progressing toward goals    Frequency    7X/week      PT Plan Current plan remains appropriate    Co-evaluation              AM-PAC PT "6 Clicks" Mobility   Outcome Measure  Help needed turning from your back to your side while in a flat bed  without using bedrails?: A Little Help needed moving from lying on your back to sitting on the side of a flat bed without using bedrails?: A Little Help needed moving to and from a bed to a chair (including a wheelchair)?: A Little Help needed standing up from a chair using your arms (e.g., wheelchair or bedside chair)?: A Little Help needed to walk in hospital room?: A Little Help needed climbing 3-5 steps with a railing? : A Little 6 Click Score: 18    End of Session Equipment Utilized During Treatment: Gait belt Activity Tolerance: Patient tolerated treatment well Patient left: in chair;with call bell/phone within reach   PT Visit Diagnosis: Other abnormalities of  gait and mobility (R26.89)     Time: 1016-1050 PT Time Calculation (min) (ACUTE ONLY): 34 min  Charges:                           Faye Ramsay, PT Acute Rehabilitation  Office: [phone] Pager: [phone]

## 2020-05-15 NOTE — Discharge Summary (Signed)
Physician Discharge Summary   Patient ID: Yvette Ferguson MRN: [id number] DOB/AGE: 1956-01-01 64 y.o.  Admit date: 05/09/2020 Discharge date: 05/10/2020  Primary Diagnosis:  Left  hip osteoarthritis.   Admission Diagnoses:  Past Medical History:  Diagnosis Date  . Arthritis   . Depression   . GERD (gastroesophageal reflux disease)   . Herpes labialis   . Hyperlipidemia   . Tremor    Discharge Diagnoses:   Active Problems:   S/P left THA, AA   S/P hip replacement, left  Estimated body mass index is 24.11 kg/m as calculated from the following:   Height as of this encounter: 5\' 4"  (1.626 m).   Weight as of this encounter: 63.7 kg.  Procedure:  Procedure(s) (LRB): TOTAL HIP ARTHROPLASTY ANTERIOR APPROACH (Left)   Consults: None  HPI: Kaeden Duce is a 64 y.o. female who had   presented to office for evaluation of left hip pain.  Radiographs revealed   progressive degenerative changes with bone-on-bone   articulation of the  hip joint, including subchondral cystic changes and osteophytes.  The patient had painful limited range of   motion significantly affecting their overall quality of life and function.  The patient was failing to    respond to conservative measures including medications and/or injections and activity modification and at this point was ready   to proceed with more definitive measures.  Consent was obtained for   benefit of pain relief.  Specific risks of infection, DVT, component   failure, dislocation, neurovascular injury, and need for revision surgery were reviewed in the office as well discussion of   the anterior versus posterior approach were reviewed.  Laboratory Data: Admission on 05/09/2020, Discharged on 05/10/2020  Component Date Value Ref Range Status  . ABO/RH(D) 05/09/2020    Final                   Value:A POS Performed at [facility], [address], [place], [place] [address]   . WBC 05/10/2020  10.2  4.0 - 10.5 K/uL Final  . RBC 05/10/2020 2.80* 3.87 - 5.11 MIL/uL Final  . Hemoglobin 05/10/2020 9.5* 12.0 - 15.0 g/dL Final  . HCT 13/01/6577 27.8* 36 - 46 % Final  . MCV 05/10/2020 99.3  80.0 - 100.0 fL Final  . MCH 05/10/2020 33.9  26.0 - 34.0 pg Final  . MCHC 05/10/2020 34.2  30.0 - 36.0 g/dL Final  . RDW 46/96/2952 12.8  11.5 - 15.5 % Final  . Platelets 05/10/2020 165  150 - 400 K/uL Final  . nRBC 05/10/2020 0.0  0.0 - 0.2 % Final   Performed at [facility], [address], [place], [place] [address]  . Sodium 05/10/2020 138  135 - 145 mmol/L Final  . Potassium 05/10/2020 3.5  3.5 - 5.1 mmol/L Final  . Chloride 05/10/2020 104  98 - 111 mmol/L Final  . CO2 05/10/2020 26  22 - 32 mmol/L Final  . Glucose, Bld 05/10/2020 151* 70 - 99 mg/dL Final   Glucose reference range applies only to samples taken after fasting for at least 8 hours.  . BUN 05/10/2020 13  8 - 23 mg/dL Final  . Creatinine, Ser 05/10/2020 0.68  0.44 - 1.00 mg/dL Final  . Calcium 44/06/270 8.3* 8.9 - 10.3 mg/dL Final  . GFR, Estimated 05/10/2020 >60  >60 mL/min Final   Comment: (NOTE) Calculated using the CKD-EPI Creatinine Equation (2021)   . Anion gap 05/10/2020 8  5 - 15 Final   Performed at [facility], [address], [place], [place] [address]  Hospital Outpatient Visit on 05/05/2020  Component Date Value Ref Range Status  . SARS Coronavirus 2 05/05/2020 NEGATIVE  NEGATIVE Final   Comment: (NOTE) SARS-CoV-2 target nucleic acids are NOT DETECTED.  The SARS-CoV-2 RNA is generally detectable in upper and lower respiratory specimens during the acute phase of infection. Negative results do not preclude SARS-CoV-2 infection, do not rule out co-infections with other pathogens, and should not be used as the sole basis for treatment or other patient management decisions. Negative results must be combined with clinical observations, patient history, and epidemiological  information. The expected result is Negative.  Fact Sheet for Patients: HairSlick.no  Fact Sheet for Healthcare Providers: quierodirigir.com  This test is not yet approved or cleared by the [place] FDA and  has been authorized for detection and/or diagnosis of SARS-CoV-2 by FDA under an Emergency Use Authorization (EUA). This EUA will remain  in effect (meaning this test can be used) for the duration of the COVID-19 declaration under Se                          ction 564(b)(1) of the Act, 21 U.S.C. section 360bbb-3(b)(1), unless the authorization is terminated or revoked sooner.  Performed at [facility], [address], [place], [place] [address]   Hospital Outpatient Visit on 04/27/2020  Component Date Value Ref Range Status  . MRSA, PCR 04/27/2020 NEGATIVE  NEGATIVE Final  . Staphylococcus aureus 04/27/2020 NEGATIVE  NEGATIVE Final   Comment: (NOTE) The Xpert SA Assay (FDA approved for NASAL specimens in patients 28 years of age and older), is one component of a comprehensive surveillance program. It is not intended to diagnose infection nor to guide or monitor treatment. Performed at [facility], [address] [place], [place] [address]   . ABO/RH(D) 04/27/2020 A POS   Final  . Antibody Screen 04/27/2020 NEG   Final  . Sample Expiration 04/27/2020 05/11/2020,2359   Final  . Extend sample reason 04/27/2020    Final                   Value:NO TRANSFUSIONS OR PREGNANCY IN THE PAST 3 MONTHS Performed at [facility], [address] [place], [place] [address]   . WBC 04/27/2020 5.7  4.0 - 10.5 K/uL Final  . RBC 04/27/2020 3.84* 3.87 - 5.11 MIL/uL Final  . Hemoglobin 04/27/2020 13.2  12.0 - 15.0 g/dL Final  . HCT 24/40/1027 38.9  36 - 46 % Final  . MCV 04/27/2020 101.3* 80.0 - 100.0 fL Final  . MCH 04/27/2020 34.4* 26.0 - 34.0 pg Final  . MCHC 04/27/2020 33.9  30.0 -  36.0 g/dL Final  . RDW 25/36/6440 13.0  11.5 - 15.5 % Final  . Platelets 04/27/2020 205  150 - 400 K/uL Final  . nRBC 04/27/2020 0.0  0.0 - 0.2 % Final   Performed at [facility], [address], [place], [place] [address]     X-Rays:DG Pelvis Portable  Result Date: 05/09/2020 CLINICAL DATA:  Status post left hip replacement. EXAM: PORTABLE PELVIS 1-2 VIEWS COMPARISON:  Intraoperative images from earlier the same day. FINDINGS: Status post left total hip replacement. No evidence for immediate hardware complication. Mild degenerative change noted right hip. No worrisome lytic or sclerotic osseous abnormality. IMPRESSION: Status post left total hip  replacement without complicating features. Electronically Signed   By: Kennith Center M.D.   On: 05/09/2020 10:36   DG C-Arm 1-60 Min-No Report  Result Date: 05/09/2020 Fluoroscopy was utilized by the requesting physician.  No radiographic interpretation.   DG HIP OPERATIVE UNILAT W OR W/O PELVIS LEFT  Result Date: 05/09/2020 CLINICAL DATA:  Left hip replacement. EXAM: OPERATIVE LEFT HIP (WITH PELVIS IF PERFORMED) 7 VIEWS TECHNIQUE: Fluoroscopic spot image(s) were submitted for interpretation post-operatively. COMPARISON:  CT 12/25/2012. FINDINGS: Total left hip replacement. Visualized hardware intact. Hemostat noted over the upper pelvis. Anatomic alignment. IMPRESSION: Total left hip replacement with anatomic alignment. Electronically Signed   By: Maisie Fus  Register   On: 05/09/2020 10:19    EKG: Orders placed or performed in visit on 12/30/14  . Exercise Tolerance Test     Hospital Course: Brittley Sobers is a 64 y.o. who was admitted to [facility]. They were brought to the operating room on 05/09/2020 and underwent Procedure(s): TOTAL HIP ARTHROPLASTY ANTERIOR APPROACH.  Patient tolerated the procedure well and was later transferred to the recovery room and then to the orthopaedic floor for postoperative  care. They were given PO and IV analgesics for pain control following their surgery. They were given 24 hours of postoperative antibiotics of  Anti-infectives (From admission, onward)   Start     Dose/Rate Route Frequency Ordered Stop   05/09/20 1715  valACYclovir (VALTREX) tablet 1,000 mg  Status:  Discontinued        1,000 mg Oral Daily 05/09/20 1616 05/10/20 1806   05/09/20 1400  ceFAZolin (ANCEF) IVPB 2g/100 mL premix        2 g 200 mL/hr over 30 Minutes Intravenous Every 6 hours 05/09/20 1048 05/09/20 2108   05/09/20 1235  ceFAZolin (ANCEF) 2-4 GM/100ML-% IVPB       Note to Pharmacy: Abran Duke   : cabinet override      05/09/20 1235 05/09/20 1240   05/09/20 0645  ceFAZolin (ANCEF) IVPB 2g/100 mL premix        2 g 200 mL/hr over 30 Minutes Intravenous On call to O.R. 05/09/20 1027 05/09/20 0843     and started on DVT prophylaxis in the form of Aspirin.   PT and OT were ordered for total joint protocol. Discharge planning consulted to help with postop disposition and equipment needs.  Patient had a good night on the evening of surgery. They started to get up OOB with therapy on POD #0. Pt was seen during rounds and was ready to go home pending progress with therapy. She worked with therapy on POD #1 and was meeting her goals. Pt was discharged to home later that day in stable condition.  Diet: Regular diet Activity: WBAT Follow-up: in 2 weeks Disposition: Home Discharged Condition: good   Discharge Instructions    Call MD / Call 911   Complete by: As directed    If you experience chest pain or shortness of breath, CALL 911 and be transported to the hospital emergency room.  If you develope a fever above 101 F, pus (white drainage) or increased drainage or redness at the wound, or calf pain, call your surgeon's office.   Call MD / Call 911   Complete by: As directed    If you experience chest pain or shortness of breath, CALL 911 and be transported to the hospital emergency  room.  If you develope a fever above 101 F, pus (white drainage) or increased drainage or redness  at the wound, or calf pain, call your surgeon's office.   Change dressing   Complete by: As directed    Maintain surgical dressing until follow up in the clinic. If the edges start to pull up, may reinforce with tape. If the dressing is no longer working, may remove and cover with gauze and tape, but must keep the area dry and clean.  Call with any questions or concerns.   Change dressing   Complete by: As directed    Maintain surgical dressing until follow up in the clinic. If the edges start to pull up, may reinforce with tape. If the dressing is no longer working, may remove and cover with gauze and tape, but must keep the area dry and clean.  Call with any questions or concerns.   Constipation Prevention   Complete by: As directed    Drink plenty of fluids.  Prune juice may be helpful.  You may use a stool softener, such as Colace (over the counter) 100 mg twice a day.  Use MiraLax (over the counter) for constipation as needed.   Constipation Prevention   Complete by: As directed    Drink plenty of fluids.  Prune juice may be helpful.  You may use a stool softener, such as Colace (over the counter) 100 mg twice a day.  Use MiraLax (over the counter) for constipation as needed.   Diet - low sodium heart healthy   Complete by: As directed    Diet - low sodium heart healthy   Complete by: As directed    Discharge instructions   Complete by: As directed    Maintain surgical dressing until follow up in the clinic. If the edges start to pull up, may reinforce with tape. If the dressing is no longer working, may remove and cover with gauze and tape, but must keep the area dry and clean.  Follow up in 2 weeks at [facility]. Call with any questions or concerns.   Discharge instructions   Complete by: As directed    Maintain surgical dressing until follow up in the clinic. If the edges start to pull up,  may reinforce with tape. If the dressing is no longer working, may remove and cover with gauze and tape, but must keep the area dry and clean.  Follow up in 2 weeks at [facility]. Call with any questions or concerns.   Increase activity slowly as tolerated   Complete by: As directed    Weight bearing as tolerated with assist device (walker, cane, etc) as directed, use it as long as suggested by your surgeon or therapist, typically at least 4-6 weeks.   Increase activity slowly as tolerated   Complete by: As directed    Weight bearing as tolerated with assist device (walker, cane, etc) as directed, use it as long as suggested by your surgeon or therapist, typically at least 4-6 weeks.   TED hose   Complete by: As directed    Use stockings (TED hose) for 2 weeks on both leg(s).  You may remove them at night for sleeping.   TED hose   Complete by: As directed    Use stockings (TED hose) for 2 weeks on both leg(s).  You may remove them at night for sleeping.     Allergies as of 05/10/2020   No Known Allergies     Medication List    TAKE these medications   ALPRAZolam 0.5 MG tablet Commonly known as: XANAX Take 1 tablet (0.5  mg total) by mouth at bedtime as needed. for sleep What changed:   reasons to take this  additional instructions   ascorbic acid 500 MG tablet Commonly known as: VITAMIN C Take 500 mg by mouth daily.   buPROPion 150 MG 24 hr tablet Commonly known as: WELLBUTRIN XL Take 150 mg by mouth daily.   escitalopram 10 MG tablet Commonly known as: LEXAPRO Take 10 mg by mouth daily.   ezetimibe 10 MG tablet Commonly known as: ZETIA Take 1 tablet (10 mg total) by mouth daily.   multivitamin tablet Take 1 tablet by mouth daily.   valACYclovir 1000 MG tablet Commonly known as: VALTREX TAKE 1 TABLET BY MOUTH EVERY DAY What changed:   how much to take  how to take this  when to take this  additional instructions            Discharge Care  Instructions  (From admission, onward)         Start     Ordered   05/10/20 0000  Change dressing       Comments: Maintain surgical dressing until follow up in the clinic. If the edges start to pull up, may reinforce with tape. If the dressing is no longer working, may remove and cover with gauze and tape, but must keep the area dry and clean.  Call with any questions or concerns.   05/10/20 0811   05/09/20 0000  Change dressing       Comments: Maintain surgical dressing until follow up in the clinic. If the edges start to pull up, may reinforce with tape. If the dressing is no longer working, may remove and cover with gauze and tape, but must keep the area dry and clean.  Call with any questions or concerns.   05/09/20 0720          Follow-up Information    Durene Romans, MD. Schedule an appointment as soon as possible for a visit in 2 weeks.   Specialty: Orthopedic Surgery Contact information: [address] [place] [address] [phone]               Signed: Dennie Bible, PA-C Orthopedic Surgery 05/15/2020, 3:34 PM

## 2021-01-31 DIAGNOSIS — R2242 Localized swelling, mass and lump, left lower limb: Secondary | ICD-10-CM | POA: Insufficient documentation

## 2021-02-13 LAB — COLOGUARD: COLOGUARD: NEGATIVE

## 2021-07-28 IMAGING — RF DG HIP (WITH PELVIS) OPERATIVE*L*
1 series · 7 of 7 positions shown · non-contrast
Comparison: CT 12/25/2012.

CLINICAL DATA: Left hip replacement.

EXAM:
OPERATIVE LEFT HIP (WITH PELVIS IF PERFORMED) 7 VIEWS
TECHNIQUE: Fluoroscopic spot image(s) were submitted for interpretation
post-operatively.

[Series 1: unknown protocol · 0.20mm/px · 7 of 7 slices shown]
[im 1/7]
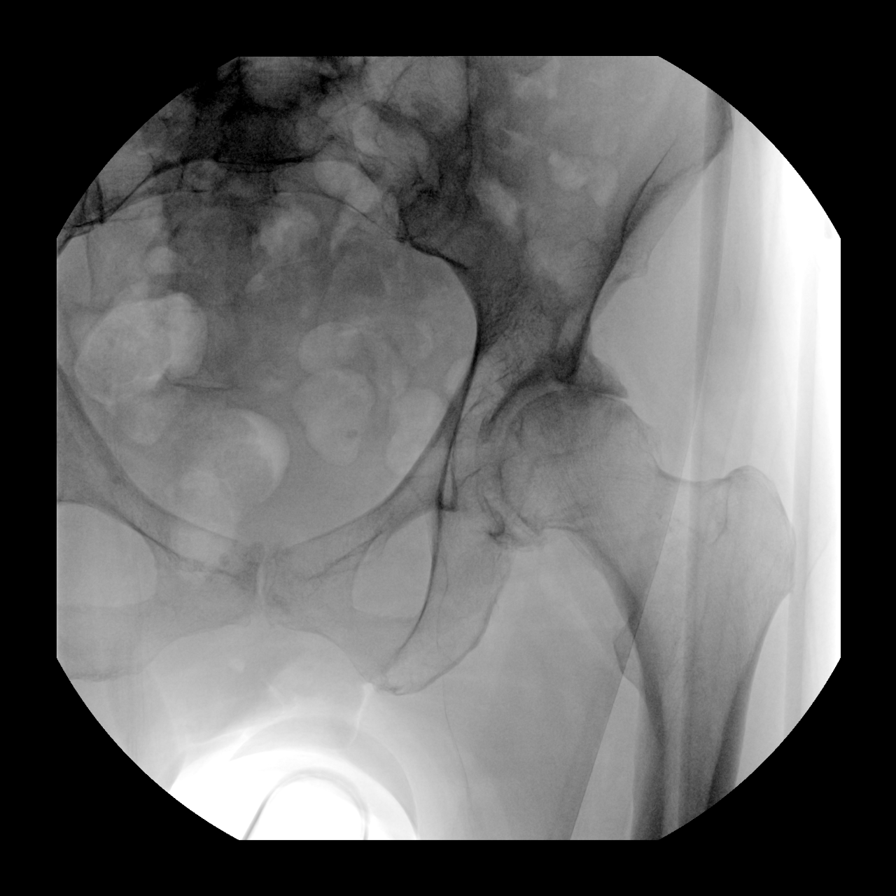
[im 2/7]
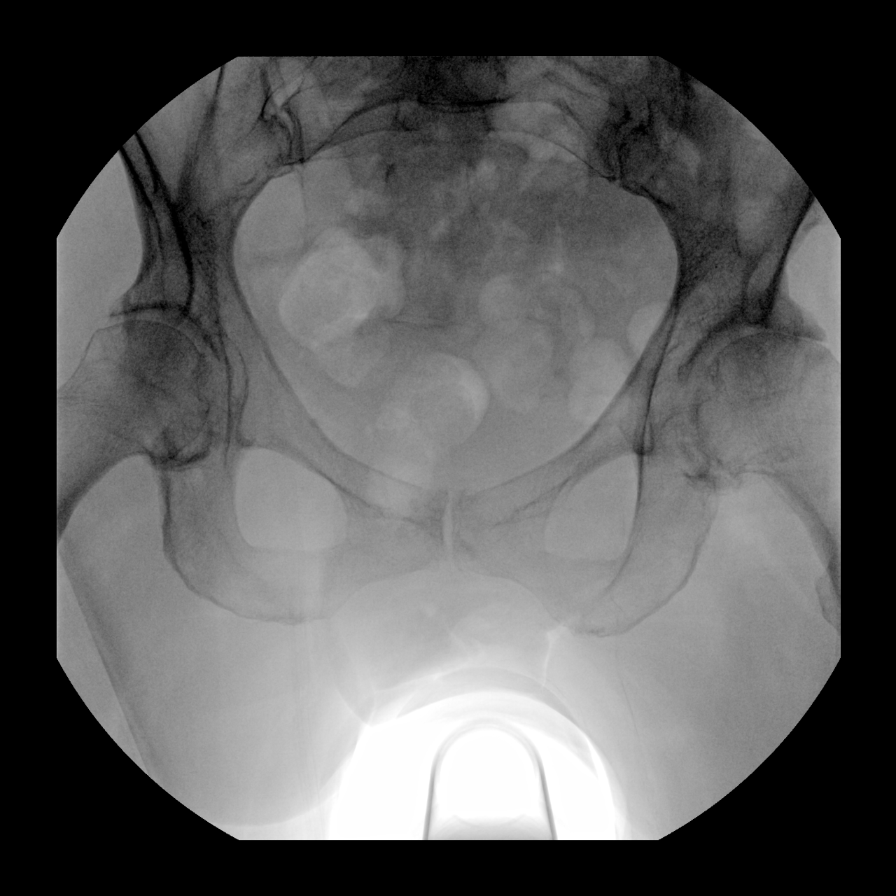
[im 3/7]
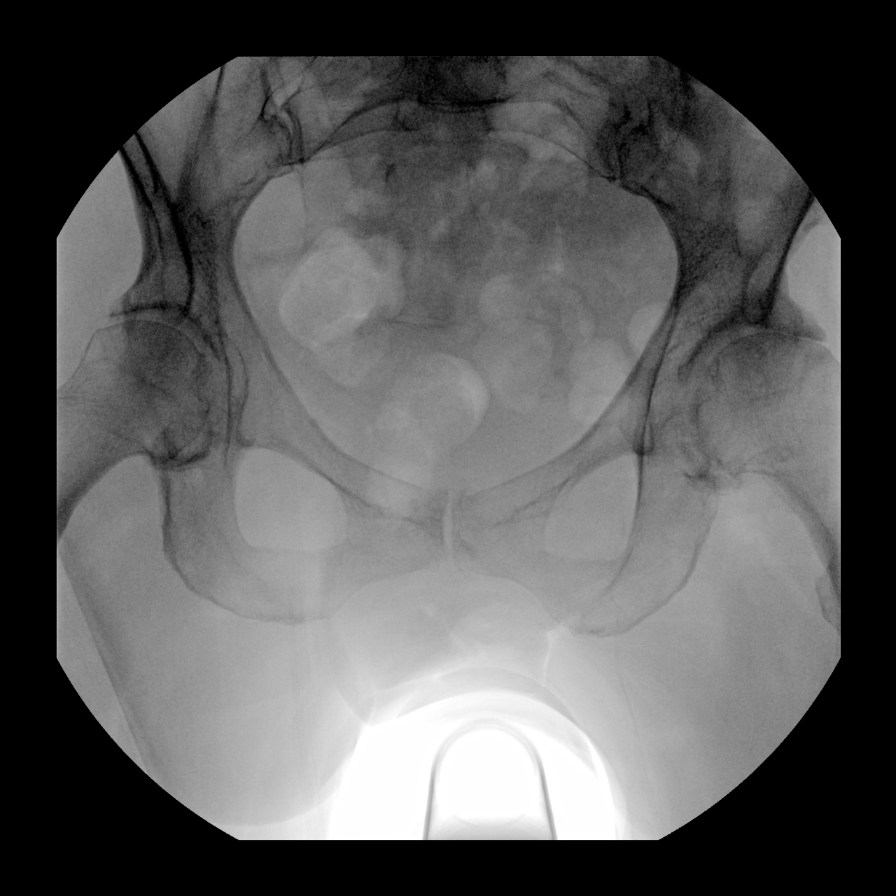
[im 4/7]
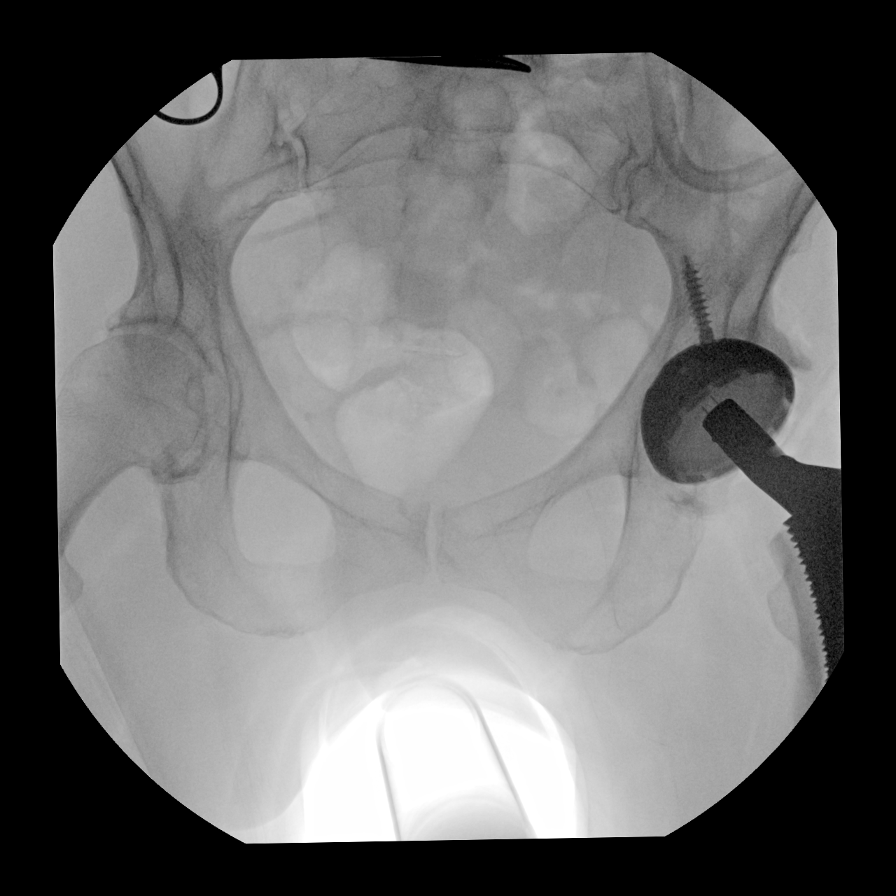
[im 5/7]
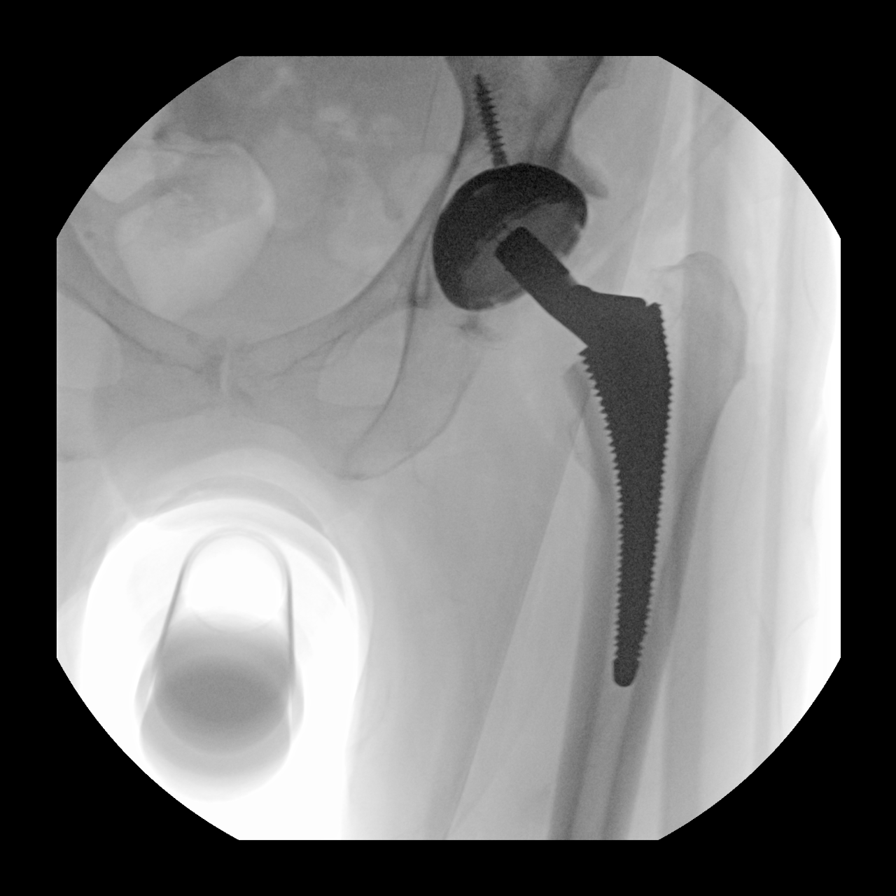
[im 6/7]
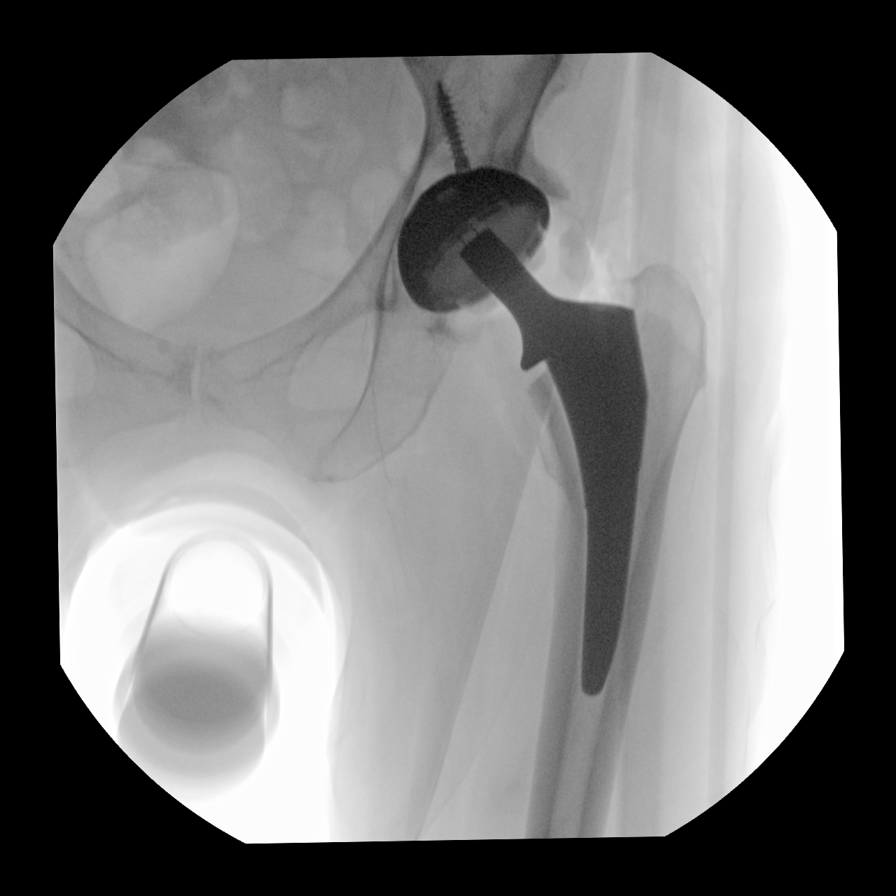
[im 7/7]
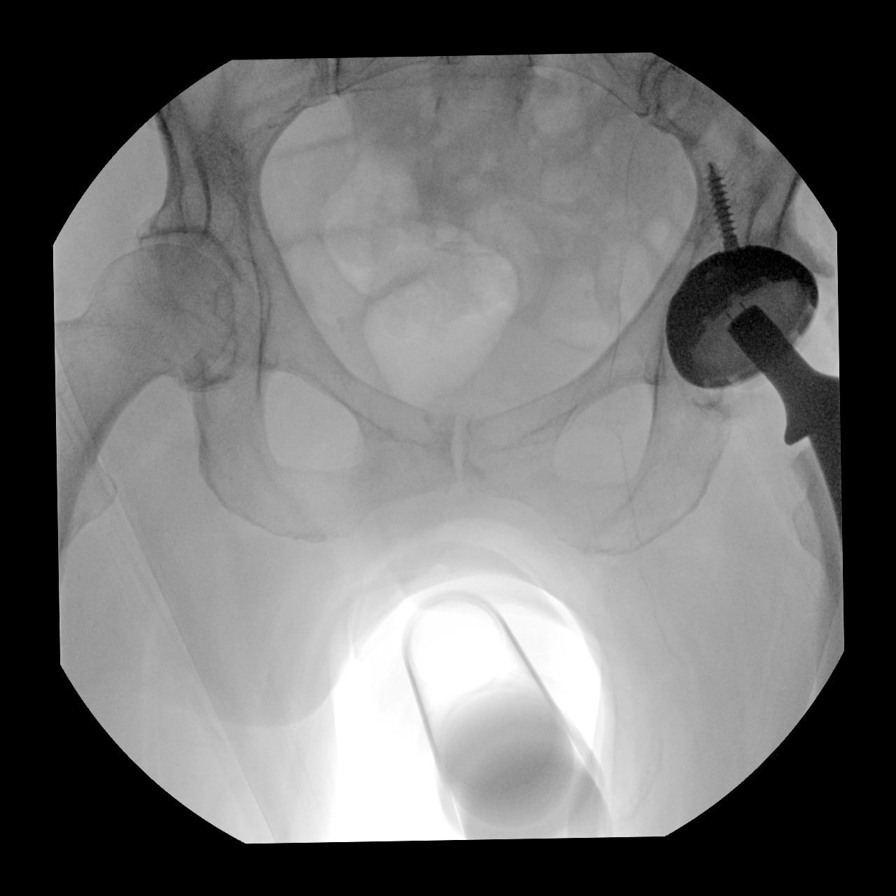

[7 of 7 positions shown; findings below may reference images not displayed]

FINDINGS: Total left hip replacement. Visualized hardware intact. Hemostat
noted over the upper pelvis. Anatomic alignment.
IMPRESSION: Total left hip replacement with anatomic alignment.

## 2021-07-28 IMAGING — DX DG PORTABLE PELVIS
1 series · 1 of 1 positions shown · non-contrast
Comparison: Intraoperative images from earlier the same day.

CLINICAL DATA: Status post left hip replacement.

EXAM:
PORTABLE PELVIS 1-2 VIEWS

[pelvis ap]
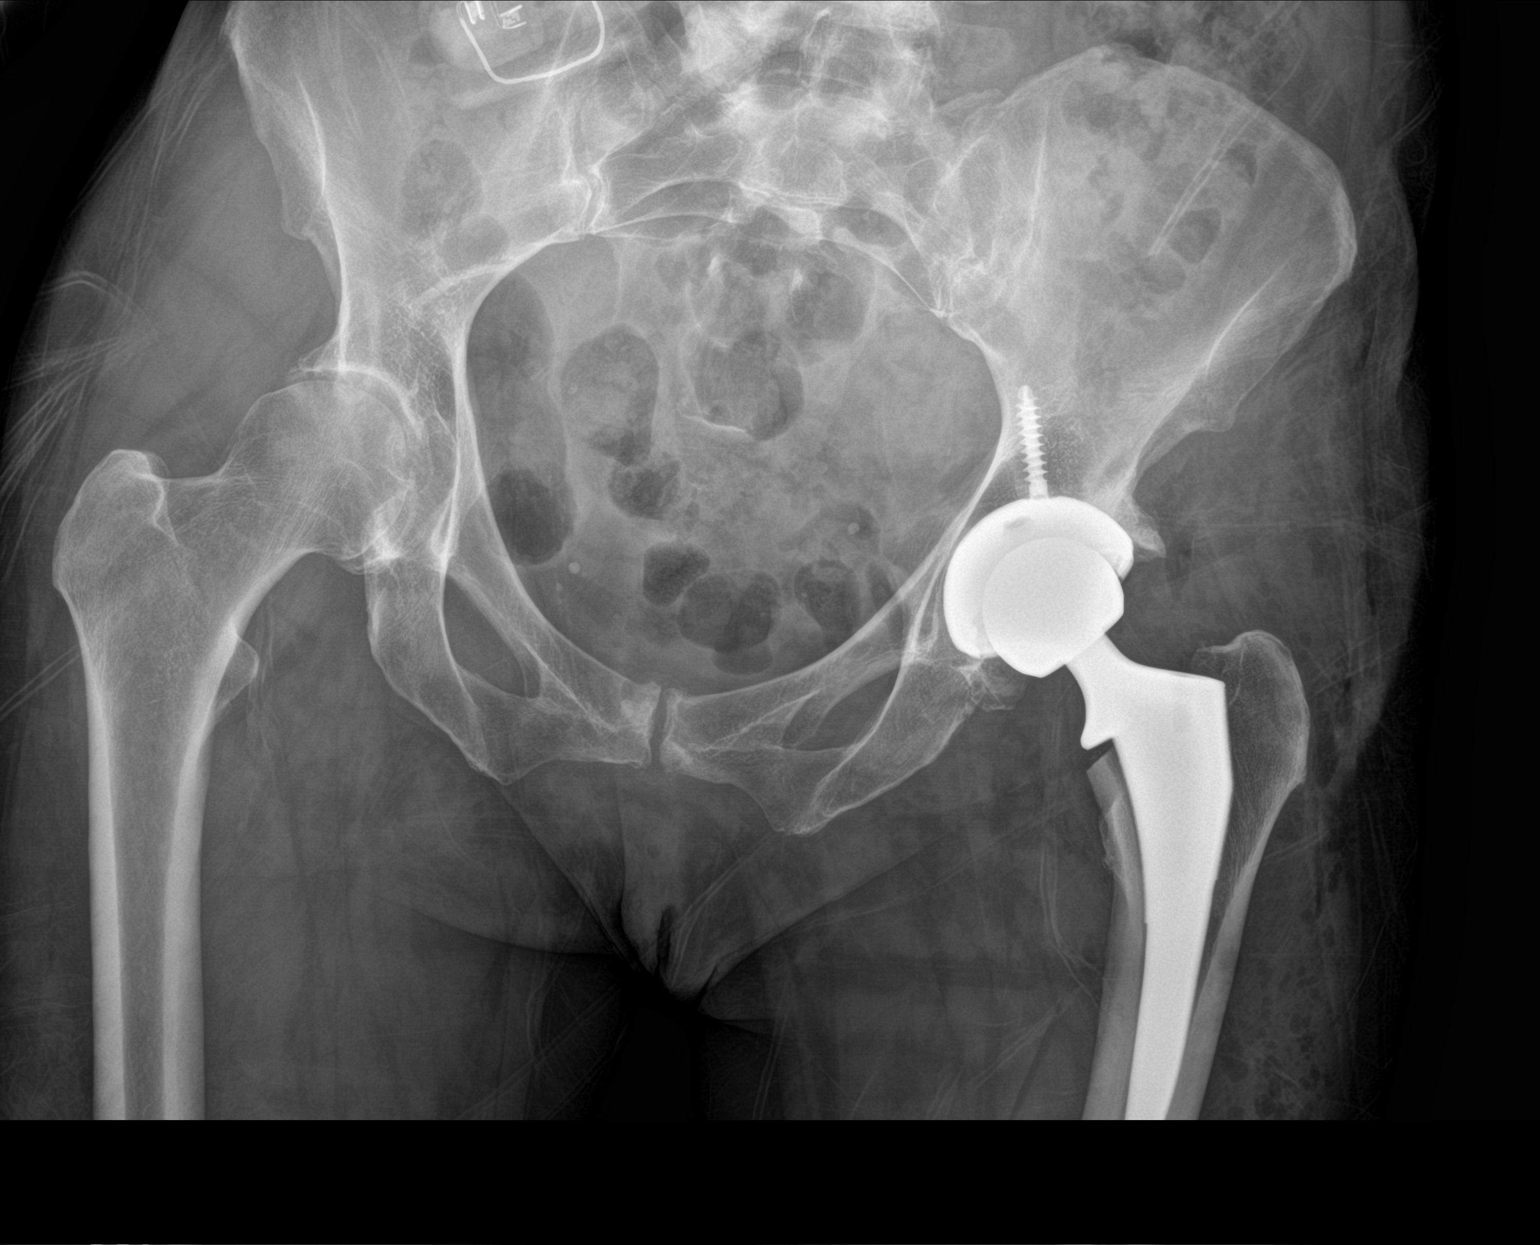

[1 of 1 positions shown; findings below may reference images not displayed]

FINDINGS: Status post left total hip replacement. No evidence for immediate
hardware complication. Mild degenerative change noted right hip. No
worrisome lytic or sclerotic osseous abnormality.
IMPRESSION: Status post left total hip replacement without complicating
features.

## 2021-10-19 ENCOUNTER — Ambulatory Visit (INDEPENDENT_AMBULATORY_CARE_PROVIDER_SITE_OTHER): Payer: Commercial Managed Care - HMO | Admitting: Nurse Practitioner

## 2021-10-19 VITALS — BP 124/76 | HR 62 | Temp 98.0°F | Ht 64.0 in | Wt 124.0 lb

## 2021-10-19 DIAGNOSIS — Z131 Encounter for screening for diabetes mellitus: Secondary | ICD-10-CM | POA: Diagnosis not present

## 2021-10-19 DIAGNOSIS — R1013 Epigastric pain: Secondary | ICD-10-CM | POA: Insufficient documentation

## 2021-10-19 DIAGNOSIS — E785 Hyperlipidemia, unspecified: Secondary | ICD-10-CM | POA: Diagnosis not present

## 2021-10-19 DIAGNOSIS — H8110 Benign paroxysmal vertigo, unspecified ear: Secondary | ICD-10-CM

## 2021-10-19 DIAGNOSIS — A6000 Herpesviral infection of urogenital system, unspecified: Secondary | ICD-10-CM | POA: Insufficient documentation

## 2021-10-19 DIAGNOSIS — Z90710 Acquired absence of both cervix and uterus: Secondary | ICD-10-CM | POA: Insufficient documentation

## 2021-10-19 DIAGNOSIS — R112 Nausea with vomiting, unspecified: Secondary | ICD-10-CM | POA: Insufficient documentation

## 2021-10-19 DIAGNOSIS — M8580 Other specified disorders of bone density and structure, unspecified site: Secondary | ICD-10-CM | POA: Insufficient documentation

## 2021-10-19 LAB — COMPREHENSIVE METABOLIC PANEL
ALT: 20 U/L (ref 0–35)
AST: 23 U/L (ref 0–37)
Albumin: 4.7 g/dL (ref 3.5–5.2)
Alkaline Phosphatase: 46 U/L (ref 39–117)
BUN: 17 mg/dL (ref 6–23)
CO2: 30 mEq/L (ref 19–32)
Calcium: 9.7 mg/dL (ref 8.4–10.5)
Chloride: 104 mEq/L (ref 96–112)
Creatinine, Ser: 0.92 mg/dL (ref 0.40–1.20)
GFR: 65.83 mL/min (ref >60.00)
Glucose, Bld: 114 mg/dL — ABNORMAL HIGH (ref 70–99)
Potassium: 4.1 mEq/L (ref 3.5–5.1)
Sodium: 141 mEq/L (ref 135–145)
Total Bilirubin: 0.8 mg/dL (ref 0.2–1.2)
Total Protein: 6.9 g/dL (ref 6.0–8.3)

## 2021-10-19 LAB — CBC
HCT: 40.3 % (ref 36.0–46.0)
Hemoglobin: 13.8 g/dL (ref 12.0–15.0)
MCHC: 34.3 g/dL (ref 30.0–36.0)
MCV: 100.9 fl — ABNORMAL HIGH (ref 78.0–100.0)
Platelets: 226 10*3/uL (ref 150.0–400.0)
RBC: 4 Mil/uL (ref 3.87–5.11)
RDW: 13.2 % (ref 11.5–15.5)
WBC: 6.6 10*3/uL (ref 4.0–10.5)

## 2021-10-19 LAB — LIPID PANEL
Cholesterol: 218 mg/dL — ABNORMAL HIGH (ref 0–200)
HDL: 75.3 mg/dL (ref >39.00)
LDL Cholesterol: 129 mg/dL — ABNORMAL HIGH (ref 0–99)
NonHDL: 143.06
Total CHOL/HDL Ratio: 3
Triglycerides: 70 mg/dL (ref 0.0–149.0)
VLDL: 14 mg/dL (ref 0.0–40.0)

## 2021-10-19 LAB — HEMOGLOBIN A1C: Hgb A1c MFr Bld: 5 % (ref 4.6–6.5)

## 2021-10-19 NOTE — Assessment & Plan Note (Signed)
A1c ordered today, further recommendations to be made based upon these results. ?

## 2021-10-19 NOTE — Assessment & Plan Note (Addendum)
Chronic, intermittent, improving.  Discussed referral to vestibular physical therapy versus treatment with meclizine as needed.  For now patient has elected to research physical therapy further and to decline meclizine usage.  If symptoms persist or worsen may revisit these options versus evaluation with neurology.  We will also collect blood work for further evaluation and other possible etiologies of her dizziness, further recommendations will be made based upon these results. ?

## 2021-10-19 NOTE — Progress Notes (Signed)
? ? ? ?Subjective:  ?Patient ID: Yvette Ferguson, female    DOB: 07-04-1955  Age: 66 y.o. MRN: [id number] ? ?CC:  ?Chief Complaint  ?Patient presents with  ? New Patient (Initial Visit)  ?  Patient would like to discuss some issues with her inner ear that seems to only happen while laying down   ?  ? ? ?HPI  ?This patient arrives today for the above. ? ?She is here to establish care as a new primary care patient.  She recently changed insurances so she had to find a new primary care provider. ? ?Overall she feels well, she does mention she has had vertigo with mild disequilibrium intermittently that started approximately 4 months ago.  She tells me laying down, rolling over, stretching, and neck extension can trigger the vertigo.  Symptoms resolved spontaneously within a few minutes.  She denies any double vision, weakness, sensory changes, or headaches.  She tells me over the last 4 months symptoms seem to be getting progressively better and not as frequent. ? ?She has hyperlipidemia for which she takes Zetia daily.She has not been on statin therapy in the past, and is hesitant to consider this at this time.  She is due for lipid panel.  She has depression for which she takes Wellbutrin, and tolerates this well.  She also takes Xanax as needed for insomnia. ? ?Past Medical History:  ?Diagnosis Date  ? Arthritis   ? Depression   ? GERD (gastroesophageal reflux disease)   ? Herpes labialis   ? Hyperlipidemia   ? Tremor   ? ? ? ? ?Family History  ?Problem Relation Age of Onset  ? Diabetes Mother   ? Pancreatic cancer Mother   ? Hypertension Mother   ? Diabetes Father   ? Leukemia Father   ? Hypertension Father   ? ? ?Social History  ? ?Social History Narrative  ? Not on file  ? ?Social History  ? ?Tobacco Use  ? Smoking status: Never  ? Smokeless tobacco: Never  ?Substance Use Topics  ? Alcohol use: Yes  ?  Alcohol/week: 1.0 standard drink  ?  Types: 1 Glasses of wine per week  ?  Comment: One daily   ? ? ? ?Current Meds  ?Medication Sig  ? ALPRAZolam (XANAX) 0.5 MG tablet Take 1 tablet (0.5 mg total) by mouth at bedtime as needed. for sleep (Patient taking differently: Take 0.5 mg by mouth at bedtime as needed for sleep.)  ? ascorbic acid (VITAMIN C) 500 MG tablet Take 500 mg by mouth daily.  ? buPROPion (WELLBUTRIN XL) 150 MG 24 hr tablet Take 150 mg by mouth daily.  ? ezetimibe (ZETIA) 10 MG tablet Take 1 tablet (10 mg total) by mouth daily.  ? Multiple Vitamin (MULTIVITAMIN) tablet Take 1 tablet by mouth daily.  ? valACYclovir (VALTREX) 1000 MG tablet TAKE 1 TABLET BY MOUTH EVERY DAY (Patient taking differently: Take 1,000 mg by mouth daily.)  ? ? ?ROS:  ?Review of Systems  ?Constitutional:  Negative for malaise/fatigue.  ?Eyes:  Negative for double vision.  ?Respiratory:  Negative for shortness of breath.   ?Cardiovascular:  Negative for chest pain.  ?Gastrointestinal:  Negative for nausea and vomiting.  ?Neurological:  Positive for dizziness and tremors (history of essential tremor; seems to be improved now). Negative for sensory change and weakness.  ? ? ?Objective:  ? ?Today's Vitals: BP 124/76 (BP Location: Left Arm, Patient Position: Sitting, Cuff Size: Normal)   Pulse 62  Temp 98 ?F (36.7 ?C) (Oral)   Ht 5\' 4"  (1.626 m)   Wt 124 lb (56.2 kg)   SpO2 99%   BMI 21.28 kg/m?  ? ?  10/19/2021  ?  8:09 AM 05/10/2020  ?  9:25 AM 05/10/2020  ?  6:17 AM  ?Vitals with BMI  ?Height 5\' 4"     ?Weight 124 lbs    ?BMI 21.27    ?Systolic 124 105 914  ?Diastolic 76 62 66  ?Pulse 62 76 88  ?  ? ?Physical Exam ?Vitals reviewed.  ?Constitutional:   ?   General: She is not in acute distress. ?   Appearance: Normal appearance.  ?HENT:  ?   Head: Normocephalic and atraumatic.  ?Neck:  ?   Vascular: No carotid bruit.  ?Cardiovascular:  ?   Rate and Rhythm: Normal rate and regular rhythm.  ?   Pulses: Normal pulses.  ?   Heart sounds: Normal heart sounds.  ?Pulmonary:  ?   Effort: Pulmonary effort is normal.  ?   Breath  sounds: Normal breath sounds.  ?Skin: ?   General: Skin is warm and dry.  ?Neurological:  ?   General: No focal deficit present.  ?   Mental Status: She is alert and oriented to person, place, and time.  ?   Cranial Nerves: Cranial nerves 2-12 are intact.  ?   Sensory: Sensation is intact.  ?   Motor: Motor function is intact.  ?   Coordination: Coordination is intact.  ?   Gait: Gait is intact.  ?Psychiatric:     ?   Mood and Affect: Mood normal.     ?   Behavior: Behavior normal.     ?   Judgment: Judgment normal.  ? ? ? ? ? ? ? ?Assessment and Plan  ? ?1. Hyperlipidemia, unspecified hyperlipidemia type   ?2. Screening for diabetes mellitus   ?3. Benign paroxysmal positional vertigo, unspecified laterality   ? ? ? ?Plan: ?See plan via problem list below. ? ? ?Tests ordered ?Orders Placed This Encounter  ?Procedures  ? Hemoglobin A1c  ? Lipid panel  ? Comprehensive metabolic panel  ? CBC  ? ? ? ? ?No orders of the defined types were placed in this encounter. ? ? ?Patient to follow-up in 1 month for annual physical exam, or sooner as needed. ? ?Elenore Paddy, NP ? ?

## 2021-10-19 NOTE — Patient Instructions (Signed)
Meclizine Chewable Tablets ?What is this medication? ?MECLIZINE (MEK li zeen) prevents and treats nausea, vomiting, and dizziness caused by motion sickness. It works by Systems analyst maintain its sense of balance. It belongs to a group of medications called antihistamines. ?This medicine may be used for other purposes; ask your health care provider or pharmacist if you have questions. ?COMMON BRAND NAME(S): Antivert, Bonine, Travel Sickness ?What should I tell my care team before I take this medication? ?They need to know if you have any of these conditions: ?Glaucoma ?Lung or breathing disease, such as asthma ?Problems urinating ?Prostate disease ?Stomach or intestine problems ?An unusual or allergic reaction to meclizine, other medications, foods, dyes, or preservatives ?Pregnant or trying to get pregnant ?Breast-feeding ?How should I use this medication? ?Take this medication by mouth with water. Take it as directed on the prescription label at the same time every day. You can chew it completely before swallowing or swallow the tablets whole. ?If you are using this medication to prevent motion sickness, take the dose at least 1 hour before travel. If it upsets your stomach, take it with food or milk. ?Talk to your care team about the use of this medication in children. While this medication may be prescribed for selected conditions, precautions do apply. ?Overdosage: If you think you have taken too much of this medicine contact a poison control center or emergency room at once. ?NOTE: This medicine is only for you. Do not share this medicine with others. ?What if I miss a dose? ?If you miss a dose, take it as soon as you can. If it is almost time for your next dose, take only that dose. Do not take double or extra doses. ?What may interact with this medication? ?Do not take this medication with any of the following: ?MAOIs, such as Carbex, Eldepryl, Marplan, Nardil, and Parnate ?This medication may also  interact with the following: ?Alcohol ?Antihistamines for allergy, cough and cold ?Certain medications for anxiety or sleep ?Certain medications for depression, such as amitriptyline, fluoxetine, sertraline ?Certain medications for seizures, such as phenobarbital, primidone ?General anesthetics, such as halothane, isoflurane, methoxyflurane, propofol ?Local anesthetics, such as lidocaine, pramoxine, tetracaine ?Medications that relax muscles for surgery ?Opioid medications for pain ?Phenothiazines, such as chlorpromazine, mesoridazine, prochlorperazine, thioridazine ?This list may not describe all possible interactions. Give your health care provider a list of all the medicines, herbs, non-prescription drugs, or dietary supplements you use. Also tell them if you smoke, drink alcohol, or use illegal drugs. Some items may interact with your medicine. ?What should I watch for while using this medication? ?Visit your care team for regular checks on your progress. Tell your care team if your symptoms do not start to get better or if they get worse. ?This medication may affect your coordination, reaction time, or judgment. Do not drive or operate machinery until you know how this medication affects you. Sit up or stand slowly to reduce the risk of dizzy or fainting spells. Drinking alcohol with this medication can increase the risk of these side effects. ?Your mouth may get dry. Chewing sugarless gum or sucking hard candy, and drinking plenty of water may help. Contact your care team if the problem does not go away or is severe. ?This medication may cause dry eyes and blurred vision. If you wear contact lenses, you may feel some discomfort. Lubricating drops may help. See your care team if the problem does not go away or is severe. ?What side effects may I  notice from receiving this medication? ?Side effects that you should report to your care team as soon as possible: ?Allergic reactions--skin rash, itching, hives,  swelling of the face, lips, tongue, or throat ?Heart rhythm changes--fast or irregular heartbeat, dizziness, feeling faint or lightheaded, chest pain, trouble breathing ?Sudden eye pain or change in vision such as blurry vision, seeing halos around lights, vision loss ?Trouble passing urine ?Side effects that usually do not require medical attention (report to your care team if they continue or are bothersome): ?Confusion ?Constipation ?Dizziness ?Drowsiness ?Dry mouth ?This list may not describe all possible side effects. Call your doctor for medical advice about side effects. You may report side effects to FDA at 1-800-FDA-1088. ?Where should I keep my medication? ?Keep out of the reach of children and pets. ?Store at room temperature between 15 and 30 degrees C (59 and 86 degrees F). Keep container tightly closed. ?Get rid of any unused medication after the expiration date. ?NOTE: This sheet is a summary. It may not cover all possible information. If you have questions about this medicine, talk to your doctor, pharmacist, or health care provider. ?? 2023 Elsevier/Gold Standard (2021-05-23 00:00:00) ? ?

## 2021-10-19 NOTE — Assessment & Plan Note (Signed)
Chronic, patient to continue Zetia 10 mg a mouth daily.  Fasting lipid panel ordered today.  Further recommendations will be made based upon these results. ?

## 2021-11-22 ENCOUNTER — Ambulatory Visit (INDEPENDENT_AMBULATORY_CARE_PROVIDER_SITE_OTHER): Payer: Commercial Managed Care - HMO | Admitting: Nurse Practitioner

## 2021-11-22 ENCOUNTER — Encounter: Payer: Self-pay | Admitting: Nurse Practitioner

## 2021-11-22 VITALS — BP 122/76 | HR 66 | Temp 98.2°F | Ht 64.0 in | Wt 121.4 lb

## 2021-11-22 DIAGNOSIS — E785 Hyperlipidemia, unspecified: Secondary | ICD-10-CM

## 2021-11-22 DIAGNOSIS — R42 Dizziness and giddiness: Secondary | ICD-10-CM | POA: Diagnosis not present

## 2021-11-22 DIAGNOSIS — R634 Abnormal weight loss: Secondary | ICD-10-CM | POA: Insufficient documentation

## 2021-11-22 DIAGNOSIS — Z0001 Encounter for general adult medical examination with abnormal findings: Secondary | ICD-10-CM | POA: Insufficient documentation

## 2021-11-22 LAB — T3, FREE: T3, Free: 3.1 pg/mL (ref 2.3–4.2)

## 2021-11-22 LAB — SEDIMENTATION RATE: Sed Rate: 2 mm/hr (ref 0–30)

## 2021-11-22 LAB — C-REACTIVE PROTEIN: CRP: <1 mg/dL (ref 0.5–20.0)

## 2021-11-22 LAB — TSH: TSH: 2.34 u[IU]/mL (ref 0.35–5.50)

## 2021-11-22 LAB — T4, FREE: Free T4: 0.82 ng/dL (ref 0.60–1.60)

## 2021-11-22 NOTE — Progress Notes (Signed)
Established Patient Office Visit  Subjective   Patient ID: Yvette Ferguson, female    DOB: 1956/01/25  Age: 66 y.o. MRN: [id number]  Chief Complaint  Patient presents with   Annual Exam    Patient arrives today for the above.  Health maintenance: She reports that she is scheduled to see her OB/GYN in approximately 2 months at which point she will undergo screening mammogram and Pap smear.  She would like to defer on breast exam today as she plans on doing this with her OB.  Dizziness: She continues to have intermittent dizziness that only last for few moments and will spontaneously resolved.  It seems to be very positional and will occur with extension of her neck, when lying down, rolling over, or stretching.  We had discussed previously trialing meclizine but she opted not to take this medication.  She does continue to have the dizziness intermittently.  She also reports some tinnitus.  She denies any falls.  Unintentional weight loss: She reports she has lost about 9 pounds over the last 6 months unintentionally.  She does state that she does not eat much during the day.  She tells me she had Cologuard completed last year which came back normal.  She denies any blood in her stool.  She does feel fatigued at times, she denies chest pain or shortness of breath.  She does report tingling to her left shoulder when she lays with her shoulder adducted the tingling sensation will spontaneously resolved when she changes position.  She denies seeing any blood in her stool, she does report stress related to interpersonal conflict with some family members.  However she uses her faith as a source of strength and does not feel she is exceptionally depressed or anxious.  HLD: She continues on Zetia for treatment of her hyperlipidemia.  Last LDL 129 which is an improvement from 173.    Review of Systems  Constitutional:  Positive for malaise/fatigue and weight loss. Negative for chills,  diaphoresis and fever.  Respiratory:  Negative for shortness of breath.   Cardiovascular:  Negative for chest pain.  Gastrointestinal:  Negative for blood in stool.  Neurological:  Positive for sensory change. Negative for weakness.  Psychiatric/Behavioral:  Negative for depression.       11/22/2021    9:05 AM 10/19/2021    8:08 AM 04/02/2017    1:31 PM  PHQ9 SCORE ONLY  PHQ-9 Total Score 0 0 0      Objective:     BP 122/76 (BP Location: Left Arm, Patient Position: Sitting, Cuff Size: Normal)   Pulse 66   Temp 98.2 F (36.8 C) (Oral)   Ht 5\' 4"  (1.626 m)   Wt 121 lb 6.4 oz (55.1 kg)   SpO2 97%   BMI 20.84 kg/m  BP Readings from Last 3 Encounters:  11/22/21 122/76  10/19/21 124/76  05/10/20 105/62   Wt Readings from Last 3 Encounters:  11/22/21 121 lb 6.4 oz (55.1 kg)  10/19/21 124 lb (56.2 kg)  05/09/20 140 lb 6.9 oz (63.7 kg)      Physical Exam Vitals reviewed.  Constitutional:      Appearance: Normal appearance.  HENT:     Head: Normocephalic and atraumatic.     Right Ear: Tympanic membrane, ear canal and external ear normal.     Left Ear: Tympanic membrane, ear canal and external ear normal.  Eyes:     General:        Right eye:  No discharge.        Left eye: No discharge.     Extraocular Movements: Extraocular movements intact.     Conjunctiva/sclera: Conjunctivae normal.     Pupils: Pupils are equal, round, and reactive to light.  Neck:     Vascular: No carotid bruit.  Cardiovascular:     Rate and Rhythm: Normal rate and regular rhythm.     Pulses: Normal pulses.     Heart sounds: Normal heart sounds. No murmur heard. Pulmonary:     Effort: Pulmonary effort is normal.     Breath sounds: Normal breath sounds.  Abdominal:     General: Abdomen is flat. Bowel sounds are normal. There is no distension.     Palpations: Abdomen is soft. There is no mass.     Tenderness: There is no abdominal tenderness.  Musculoskeletal:        General: No tenderness.      Cervical back: Neck supple. No muscular tenderness.     Right lower leg: No edema.     Left lower leg: No edema.  Lymphadenopathy:     Cervical: No cervical adenopathy.     Upper Body:     Right upper body: No supraclavicular adenopathy.     Left upper body: No supraclavicular adenopathy.  Skin:    General: Skin is warm and dry.  Neurological:     General: No focal deficit present.     Mental Status: She is alert and oriented to person, place, and time.     Motor: No weakness.     Gait: Gait normal.  Psychiatric:        Mood and Affect: Mood normal.        Behavior: Behavior normal.        Judgment: Judgment normal.      No results found for any visits on 11/22/21.    The 10-year ASCVD risk score (Arnett DK, et al., 2019) is: 4.1%    Assessment & Plan:   Problem List Items Addressed This Visit       Other   Hyperlipidemia    Chronic, stable.  LDL still above goal however patient has not tolerated statins in the past.  For now she will continue on Zetia and she was encouraged to focus on healthy diet.      Unintentional weight loss - Primary    Acute, depression screening negative today.  Negative for anemia on last blood work.  Negative for uncontrolled diabetes.  Negative for metabolic abnormalities except for mildly reduced GFR.  She will have recommended cancer screenings completed within the next couple months.  We will check nonspecific inflammatory markers as well as thyroid function testing for further evaluation.  We will follow-up in 6 weeks for close monitoring of weight.  She is encouraged to monitor her weight at home and to make sure she eats regularly throughout the day.  Further recommendations may be made based upon these results.      Relevant Orders   TSH   T3, free   T4, free   C-reactive protein   Sedimentation rate   Dizziness after extension of neck    Chronic, stable.  No abnormal neurologic signs on exam today, symptoms resolve  spontaneously within a few moments.  They are caused by specific position changes, she denies falls.  Etiology unclear, however I feel low likelihood this is related to stroke or TIA.  Concern for possible reduced blood flow to the brain with neck  extension.  Thus, will refer to vascular surgery for further evaluation of her dizziness.       Relevant Orders   Ambulatory referral to Vascular Surgery   Encounter for general adult medical examination with abnormal findings    Overall exam appears benign.  She is encouraged to follow-up with OB for mammogram and Pap smear.  She reports understanding.       Return in about 6 weeks (around 01/03/2022) for With Reece Mcbroom follow-up .    Elenore Paddy, NP

## 2021-11-22 NOTE — Assessment & Plan Note (Signed)
Chronic, stable.  No abnormal neurologic signs on exam today, symptoms resolve spontaneously within a few moments.  They are caused by specific position changes, she denies falls.  Etiology unclear, however I feel low likelihood this is related to stroke or TIA.  Concern for possible reduced blood flow to the brain with neck extension.  Thus, will refer to vascular surgery for further evaluation of her dizziness.

## 2021-11-22 NOTE — Assessment & Plan Note (Signed)
Acute, depression screening negative today.  Negative for anemia on last blood work.  Negative for uncontrolled diabetes.  Negative for metabolic abnormalities except for mildly reduced GFR.  She will have recommended cancer screenings completed within the next couple months.  We will check nonspecific inflammatory markers as well as thyroid function testing for further evaluation.  We will follow-up in 6 weeks for close monitoring of weight.  She is encouraged to monitor her weight at home and to make sure she eats regularly throughout the day.  Further recommendations may be made based upon these results.

## 2021-11-22 NOTE — Assessment & Plan Note (Signed)
Chronic, stable.  LDL still above goal however patient has not tolerated statins in the past.  For now she will continue on Zetia and she was encouraged to focus on healthy diet.

## 2021-11-22 NOTE — Assessment & Plan Note (Signed)
Overall exam appears benign.  She is encouraged to follow-up with OB for mammogram and Pap smear.  She reports understanding.

## 2022-01-10 ENCOUNTER — Ambulatory Visit: Payer: Commercial Managed Care - HMO | Admitting: Nurse Practitioner

## 2022-01-11 ENCOUNTER — Encounter: Payer: Commercial Managed Care - HMO | Admitting: Vascular Surgery

## 2022-01-11 ENCOUNTER — Encounter (HOSPITAL_COMMUNITY): Payer: Commercial Managed Care - HMO

## 2022-01-25 ENCOUNTER — Ambulatory Visit: Payer: Commercial Managed Care - HMO | Admitting: Nurse Practitioner

## 2022-02-07 ENCOUNTER — Ambulatory Visit (INDEPENDENT_AMBULATORY_CARE_PROVIDER_SITE_OTHER): Payer: Commercial Managed Care - HMO | Admitting: Nurse Practitioner

## 2022-02-07 VITALS — BP 144/80 | HR 66 | Temp 97.8°F | Ht 64.0 in | Wt 120.1 lb

## 2022-02-07 DIAGNOSIS — R42 Dizziness and giddiness: Secondary | ICD-10-CM | POA: Diagnosis not present

## 2022-02-07 DIAGNOSIS — R634 Abnormal weight loss: Secondary | ICD-10-CM

## 2022-02-07 DIAGNOSIS — M25512 Pain in left shoulder: Secondary | ICD-10-CM

## 2022-02-07 DIAGNOSIS — M25511 Pain in right shoulder: Secondary | ICD-10-CM | POA: Insufficient documentation

## 2022-02-07 DIAGNOSIS — G8929 Other chronic pain: Secondary | ICD-10-CM | POA: Diagnosis not present

## 2022-02-07 NOTE — Assessment & Plan Note (Signed)
Chronic, acutely flaring up now. No significant abnormalities on exam. Recommend consider referral to sports medicine. Patient feels concern is mild, would prefer not to undergo additional evaluation or work with PT as of yet. Recommend she rest the shoulder when able and use ice as needed. Patient reports understanding and encouraged to let me know if pain worsens. Patient reports her understanding.

## 2022-02-07 NOTE — Assessment & Plan Note (Signed)
Patient continues to lose weight, so far work-up has been negative.  Patient does plan on getting her routine screenings completed.  She does feel that her weight loss is more related to her higher level of activity at this time.  She has no other signs or symptoms that she is concerned about that could be related to her weight loss.  For now she would continue to like to monitor it, she was encouraged let me know if she continues to wait.  If she does may need to consider CT scan of abdomen or pelvis.  She reports her understanding.

## 2022-02-07 NOTE — Progress Notes (Signed)
Established Patient Office Visit  Subjective   Patient ID: Yvette Ferguson, female    DOB: 12/19/1955  Age: 66 y.o. MRN: [id number]  Chief Complaint  Patient presents with   Follow-up    6 weeks follow up    Shoulder Pain    Having right shoulder pain     Right shoulder pain: This is a chronic concern that has been intermittent over the last few years.  She reports that her right shoulder pain has been getting worse here recently, however is able to maintain range of motion.  She has a history of injection in her right shoulder for treatment.  She reports she is working more frequently here recently and is having to perform more overhead motion with her right arm.  She also mentions that she hears a popping noise at times when performing his overhead motions.  Unintentional weight loss: Still waiting on getting Pap and mammogram completed, tells me last colonoscopy was last year and she reports that it was completely clear.  Blood work so far has not identified obvious cause (stable kidney function, normal electrolytes, mild hyperlipidemia, normal CRP and sed rate, no anemia, A1c 5.0, thyroid panel normal as well).  Patient does not feel exceptionally unwell or fatigued.  She does report that she is very active.  Dizziness: This is still occurring on an intermittent basis mostly with positional changes.  Does sometimes occur with neck extension.  Was referred to vascular surgery for assistance evaluation but she tells me the office was not in her network so she did not have evaluation completed.  She feels the dizziness is more "prominent" but not exceptionally worse compared to what it has been historically.  The dizziness will subside quickly with rest.    Review of Systems  Constitutional:  Positive for weight loss. Negative for fever and malaise/fatigue.  Respiratory:  Negative for shortness of breath.   Cardiovascular:  Negative for chest pain.  Gastrointestinal:  Negative  for abdominal pain and blood in stool.  Musculoskeletal:  Positive for joint pain.  Neurological:  Positive for dizziness.      Objective:     BP (!) 144/80   Pulse 66   Temp 97.8 F (36.6 C) (Oral)   Ht 5\' 4"  (1.626 m)   Wt 120 lb 2 oz (54.5 kg)   SpO2 99%   BMI 20.62 kg/m  BP Readings from Last 3 Encounters:  02/07/22 (!) 144/80  11/22/21 122/76  10/19/21 124/76   Wt Readings from Last 3 Encounters:  02/07/22 120 lb 2 oz (54.5 kg)  11/22/21 121 lb 6.4 oz (55.1 kg)  10/19/21 124 lb (56.2 kg)      Physical Exam Vitals reviewed.  Constitutional:      General: She is not in acute distress.    Appearance: Normal appearance.  HENT:     Head: Normocephalic and atraumatic.  Neck:     Vascular: No carotid bruit.  Cardiovascular:     Rate and Rhythm: Normal rate and regular rhythm.     Pulses: Normal pulses.     Heart sounds: Normal heart sounds.  Pulmonary:     Effort: Pulmonary effort is normal.     Breath sounds: Normal breath sounds.  Musculoskeletal:     Right shoulder: Normal. No swelling, effusion, tenderness or bony tenderness. Normal range of motion. Normal strength.     Left shoulder: Normal. No swelling, effusion, tenderness or bony tenderness. Normal range of motion. Normal strength.  Skin:  General: Skin is warm and dry.  Neurological:     General: No focal deficit present.     Mental Status: She is alert and oriented to person, place, and time.  Psychiatric:        Mood and Affect: Mood normal.        Behavior: Behavior normal.        Judgment: Judgment normal.      No results found for any visits on 02/07/22.    The 10-year ASCVD risk score (Arnett DK, et al., 2019) is: 5.7%    Assessment & Plan:   Problem List Items Addressed This Visit       Other   Chronic left shoulder pain    Chronic, acutely flaring up now. No significant abnormalities on exam. Recommend consider referral to sports medicine. Patient feels concern is mild,  would prefer not to undergo additional evaluation or work with PT as of yet. Recommend she rest the shoulder when able and use ice as needed. Patient reports understanding and encouraged to let me know if pain worsens. Patient reports her understanding.       Unintentional weight loss - Primary    Patient continues to lose weight, so far work-up has been negative.  Patient does plan on getting her routine screenings completed.  She does feel that her weight loss is more related to her higher level of activity at this time.  She has no other signs or symptoms that she is concerned about that could be related to her weight loss.  For now she would continue to like to monitor it, she was encouraged let me know if she continues to wait.  If she does may need to consider CT scan of abdomen or pelvis.  She reports her understanding.      Dizziness after extension of neck    Recommend evaluation with vascular surgery, educated that dizziness with extension could be related to lack of blood flow to brain which is a serious concern. She would prefer to hold off on evaluation until she enrolls in medicare in the next couple of months.  Patient encouraged let me know if symptoms worsen between now and then, she reports understanding.       Return in about 3 months (around 05/09/2022) for f/u with Maralyn Sago.    Elenore Paddy, NP

## 2022-02-07 NOTE — Assessment & Plan Note (Signed)
Recommend evaluation with vascular surgery, educated that dizziness with extension could be related to lack of blood flow to brain which is a serious concern. She would prefer to hold off on evaluation until she enrolls in medicare in the next couple of months.  Patient encouraged let me know if symptoms worsen between now and then, she reports understanding.

## 2022-04-11 ENCOUNTER — Other Ambulatory Visit: Payer: Self-pay | Admitting: Obstetrics and Gynecology

## 2022-04-11 DIAGNOSIS — G8929 Other chronic pain: Secondary | ICD-10-CM

## 2022-04-11 DIAGNOSIS — R634 Abnormal weight loss: Secondary | ICD-10-CM

## 2022-05-09 ENCOUNTER — Ambulatory Visit (INDEPENDENT_AMBULATORY_CARE_PROVIDER_SITE_OTHER): Payer: Commercial Managed Care - HMO | Admitting: Nurse Practitioner

## 2022-05-09 VITALS — BP 140/84 | HR 68 | Temp 98.3°F | Ht 64.0 in | Wt 124.4 lb

## 2022-05-09 DIAGNOSIS — R634 Abnormal weight loss: Secondary | ICD-10-CM

## 2022-05-09 DIAGNOSIS — N83209 Unspecified ovarian cyst, unspecified side: Secondary | ICD-10-CM | POA: Diagnosis not present

## 2022-05-09 DIAGNOSIS — E785 Hyperlipidemia, unspecified: Secondary | ICD-10-CM

## 2022-05-09 DIAGNOSIS — I10 Essential (primary) hypertension: Secondary | ICD-10-CM

## 2022-05-09 DIAGNOSIS — M25511 Pain in right shoulder: Secondary | ICD-10-CM | POA: Diagnosis not present

## 2022-05-09 DIAGNOSIS — G8929 Other chronic pain: Secondary | ICD-10-CM

## 2022-05-09 MED ORDER — EZETIMIBE 10 MG PO TABS: 10.0000 mg | ORAL_TABLET | Freq: Every day | ORAL | 3 refills | Status: DC

## 2022-05-09 NOTE — Assessment & Plan Note (Signed)
Acute, patient encouraged to follow-up with OB/GYN as scheduled.  Patient reports plan to do so.

## 2022-05-09 NOTE — Assessment & Plan Note (Signed)
Chronic, for now patient to use over-the-counter medication such as Tylenol and or ibuprofen for pain management.  Patient to notify me if she would like to undergo evaluation with physical therapy and/or orthopedics.

## 2022-05-09 NOTE — Assessment & Plan Note (Signed)
Seems to have resolved, continue to monitor.

## 2022-05-09 NOTE — Assessment & Plan Note (Signed)
Chronic, patient to continue taking Zetia.  Refill sent to pharmacy today.

## 2022-05-09 NOTE — Progress Notes (Signed)
Established Patient Office Visit  Subjective   Patient ID: Yvette Ferguson, female    DOB: [birth year]  Age: 66 y.o. MRN: [id number]  Chief Complaint  Patient presents with   Hyperlipidemia    Hyperlipidemia: Continues on Zetia 10 mg mouth daily, tolerates medication well.  Last LDL 129.  Has elected against using statin therapy in the past.  Elevated blood pressure: Blood pressure elevated at last office visit, patient does not have history of hypertension.  Does report some stress related to interpersonal relations with the family as well as the fact that its holiday season.  Unintentional weight loss/ovarian cysts: Reports weight seems to have stabilized.  Has undergone Pap smear, mammogram, and colonoscopy all which have been negative for signs of malignancy.  She did experience lower abdominal pain when seeing OB/GYN for Pap smear, she ended up doing ultrasound which showed large cysts on bilateral ovaries.  She is planning on undergoing CT scan for further evaluation of this as ordered by OB/GYN.  Right shoulder pain: Chronic problem, would like to hold off on physical therapy or surgery at this point.    ROS: See HPI    Objective:     BP (!) 140/84   Pulse 68   Temp 98.3 F (36.8 C) (Oral)   Ht 5\' 4"  (1.626 m)   Wt 124 lb 6 oz (56.4 kg)   SpO2 98%   BMI 21.35 kg/m  BP Readings from Last 3 Encounters:  05/09/22 (!) 140/84  02/07/22 (!) 144/80  11/22/21 122/76   Wt Readings from Last 3 Encounters:  05/09/22 124 lb 6 oz (56.4 kg)  02/07/22 120 lb 2 oz (54.5 kg)  11/22/21 121 lb 6.4 oz (55.1 kg)      Physical Exam Vitals reviewed.  Constitutional:      General: She is not in acute distress.    Appearance: Normal appearance.  HENT:     Head: Normocephalic and atraumatic.  Neck:     Vascular: No carotid bruit.  Cardiovascular:     Rate and Rhythm: Normal rate and regular rhythm.     Pulses: Normal pulses.     Heart sounds: Normal heart sounds.   Pulmonary:     Effort: Pulmonary effort is normal.     Breath sounds: Normal breath sounds.  Skin:    General: Skin is warm and dry.  Neurological:     General: No focal deficit present.     Mental Status: She is alert and oriented to person, place, and time.  Psychiatric:        Mood and Affect: Mood normal.        Behavior: Behavior normal.        Judgment: Judgment normal.      No results found for any visits on 05/09/22.    The 10-year ASCVD risk score (Arnett DK, et al., 2019) is: 5.4%    Assessment & Plan:   Problem List Items Addressed This Visit       Cardiovascular and Mediastinum   HTN (hypertension)    New diagnosis, patient would like to hold off on initiating treatment until after the holidays to see if blood pressure improves.  Patient to follow-up in 3 months for blood pressure check.      Relevant Medications   ezetimibe (ZETIA) 10 MG tablet     Endocrine   Ovarian cyst    Acute, patient encouraged to follow-up with OB/GYN as scheduled.  Patient reports plan to do so.  Other   Hyperlipidemia    Chronic, patient to continue taking Zetia.  Refill sent to pharmacy today.      Relevant Medications   ezetimibe (ZETIA) 10 MG tablet   Right shoulder pain    Chronic, for now patient to use over-the-counter medication such as Tylenol and or ibuprofen for pain management.  Patient to notify me if she would like to undergo evaluation with physical therapy and/or orthopedics.      Unintentional weight loss - Primary    Seems to have resolved, continue to monitor.       Return in about 3 months (around 08/08/2022) for f/u with Camari Wisham for BP check.    Elenore Paddy, NP

## 2022-05-09 NOTE — Assessment & Plan Note (Signed)
New diagnosis, patient would like to hold off on initiating treatment until after the holidays to see if blood pressure improves.  Patient to follow-up in 3 months for blood pressure check.

## 2022-05-17 ENCOUNTER — Ambulatory Visit
Admission: RE | Admit: 2022-05-17 | Discharge: 2022-05-17 | Disposition: A | Discharge status: Home / Self Care | Payer: Commercial Managed Care - HMO | Source: Ambulatory Visit | Attending: Obstetrics and Gynecology | Admitting: Obstetrics and Gynecology

## 2022-05-17 DIAGNOSIS — R634 Abnormal weight loss: Secondary | ICD-10-CM

## 2022-05-17 DIAGNOSIS — G8929 Other chronic pain: Secondary | ICD-10-CM

## 2022-05-17 MED ORDER — IOPAMIDOL (ISOVUE-300) INJECTION 61%
100.0000 mL | Freq: Once | INTRAVENOUS | Status: AC | PRN
  Administered 2022-05-17: 100 mL via INTRAVENOUS

## 2022-05-22 ENCOUNTER — Other Ambulatory Visit: Payer: Self-pay | Admitting: Obstetrics and Gynecology

## 2022-05-22 ENCOUNTER — Encounter: Payer: Self-pay | Admitting: Obstetrics and Gynecology

## 2022-05-22 DIAGNOSIS — N83291 Other ovarian cyst, right side: Secondary | ICD-10-CM

## 2022-06-17 ENCOUNTER — Other Ambulatory Visit: Payer: Commercial Managed Care - HMO

## 2022-07-17 ENCOUNTER — Ambulatory Visit
Admission: RE | Admit: 2022-07-17 | Discharge: 2022-07-17 | Disposition: A | Discharge status: Home / Self Care | Payer: PPO | Source: Ambulatory Visit | Attending: Obstetrics and Gynecology | Admitting: Obstetrics and Gynecology

## 2022-07-17 DIAGNOSIS — N838 Other noninflammatory disorders of ovary, fallopian tube and broad ligament: Secondary | ICD-10-CM | POA: Diagnosis not present

## 2022-07-17 DIAGNOSIS — N83291 Other ovarian cyst, right side: Secondary | ICD-10-CM

## 2022-07-17 MED ORDER — GADOPICLENOL 0.5 MMOL/ML IV SOLN
6.0000 mL | Freq: Once | INTRAVENOUS | Status: AC | PRN
  Administered 2022-07-17: 6 mL via INTRAVENOUS

## 2022-08-01 DIAGNOSIS — N83209 Unspecified ovarian cyst, unspecified side: Secondary | ICD-10-CM | POA: Diagnosis not present

## 2022-08-08 ENCOUNTER — Ambulatory Visit (INDEPENDENT_AMBULATORY_CARE_PROVIDER_SITE_OTHER): Payer: PPO | Admitting: Nurse Practitioner

## 2022-08-08 VITALS — BP 132/74 | HR 60 | Temp 97.7°F | Ht 64.0 in | Wt 123.0 lb

## 2022-08-08 DIAGNOSIS — E785 Hyperlipidemia, unspecified: Secondary | ICD-10-CM

## 2022-08-08 DIAGNOSIS — M25511 Pain in right shoulder: Secondary | ICD-10-CM | POA: Diagnosis not present

## 2022-08-08 DIAGNOSIS — I10 Essential (primary) hypertension: Secondary | ICD-10-CM | POA: Diagnosis not present

## 2022-08-08 NOTE — Assessment & Plan Note (Signed)
Chronic, has been borderline recently in the past.  Patient has reduced salt intake.  Today blood pressure much better controlled, will continue off of prescription medication for management as of right now.

## 2022-08-08 NOTE — Patient Instructions (Signed)
FIA - Females in action

## 2022-08-08 NOTE — Assessment & Plan Note (Signed)
Chronic, patient to continue on Zetia.  Discussed Niacin's and that if she would like to trial this supplement she could consider this but I would recommend staying on Zetia alone for now.  Patient reports understanding.

## 2022-08-08 NOTE — Assessment & Plan Note (Signed)
Acute, etiology unclear possibly tendinitis.  For now patient to use NSAIDs as needed and will refer to sports medicine for further evaluation

## 2022-08-08 NOTE — Progress Notes (Signed)
Established Patient Office Visit  Subjective   Patient ID: Yvette Ferguson, female    DOB: [birth year]  Age: 67 y.o. MRN: [id number]  Chief Complaint  Patient presents with   Shoulder Pain    Right shoulder pain: Symptom onset yesterday after vacuuming.  Felt a pop and then has been experiencing severe pain with forward flexion and abduction.  Has taken Advil with mild improvement in pain.  Denies weakness or sensory changes.  Reports this has happened before in the past and has had steroid shot with improvement.  Upper lipidemia: Currently on Zetia.  Tolerating well.  Wanted to discuss possibly starting niacin    ROS: see HPI    Objective:     BP 132/74   Pulse 60   Temp 97.7 F (36.5 C) (Temporal)   Ht 5\' 4"  (1.626 m)   Wt 123 lb (55.8 kg)   SpO2 100%   BMI 21.11 kg/m  BP Readings from Last 3 Encounters:  08/08/22 132/74  05/09/22 (!) 140/84  02/07/22 (!) 144/80   Wt Readings from Last 3 Encounters:  08/08/22 123 lb (55.8 kg)  05/09/22 124 lb 6 oz (56.4 kg)  02/07/22 120 lb 2 oz (54.5 kg)      Physical Exam Vitals reviewed.  Constitutional:      General: She is not in acute distress.    Appearance: Normal appearance.  HENT:     Head: Normocephalic and atraumatic.  Neck:     Vascular: No carotid bruit.  Cardiovascular:     Rate and Rhythm: Normal rate and regular rhythm.     Pulses: Normal pulses.     Heart sounds: Normal heart sounds.  Pulmonary:     Effort: Pulmonary effort is normal.     Breath sounds: Normal breath sounds.  Musculoskeletal:     Right shoulder: No swelling. Decreased range of motion. Normal pulse.     Left shoulder: No swelling. Normal range of motion. Normal pulse.  Skin:    General: Skin is warm and dry.  Neurological:     General: No focal deficit present.     Mental Status: She is alert and oriented to person, place, and time.     Sensory: No sensory deficit.     Motor: No weakness.  Psychiatric:        Mood and  Affect: Mood normal.        Behavior: Behavior normal.        Judgment: Judgment normal.      No results found for any visits on 08/08/22.    The 10-year ASCVD risk score (Arnett DK, et al., 2019) is: 5.4%    Assessment & Plan:   Problem List Items Addressed This Visit       Cardiovascular and Mediastinum   HTN (hypertension)    Chronic, has been borderline recently in the past.  Patient has reduced salt intake.  Today blood pressure much better controlled, will continue off of prescription medication for management as of right now.        Other   Hyperlipidemia    Chronic, patient to continue on Zetia.  Discussed Niacin's and that if she would like to trial this supplement she could consider this but I would recommend staying on Zetia alone for now.  Patient reports understanding.      Right shoulder pain - Primary    Acute, etiology unclear possibly tendinitis.  For now patient to use NSAIDs as needed and will refer to  sports medicine for further evaluation      Relevant Orders   Ambulatory referral to Sports Medicine    Return in about 3 months (around 11/06/2022) for 3-6 months with Maralyn Sago.    Elenore Paddy, NP

## 2022-08-13 NOTE — Progress Notes (Unsigned)
   Shirlyn Goltz, PhD, LAT, ATC acting as a scribe for Lynne Leader, MD. Subjective:    CC: Right shoulder pain  HPI: Patient is a 67 year old female presenting with right shoulder pain ongoing since 2/28.  Patient was vacuuming and felt a pop in her right shoulder.  Patient locates pain to***  Aggravates: Shoulder flexion, aBd Treatments tried: IBU  Pertinent review of Systems: ***  Relevant historical information: ***   Objective:   There were no vitals filed for this visit. General: Well Developed, well nourished, and in no acute distress.   MSK: ***  Lab and Radiology Results No results found for this or any previous visit (from the past 72 hour(s)). No results found.    Impression and Recommendations:    Assessment and Plan: 67 y.o. female with ***.  PDMP not reviewed this encounter. No orders of the defined types were placed in this encounter.  No orders of the defined types were placed in this encounter.   Discussed warning signs or symptoms. Please see discharge instructions. Patient expresses understanding.   ***

## 2022-08-14 ENCOUNTER — Ambulatory Visit: Payer: Self-pay

## 2022-08-14 ENCOUNTER — Ambulatory Visit (INDEPENDENT_AMBULATORY_CARE_PROVIDER_SITE_OTHER): Payer: PPO | Admitting: Family Medicine

## 2022-08-14 ENCOUNTER — Ambulatory Visit (INDEPENDENT_AMBULATORY_CARE_PROVIDER_SITE_OTHER): Payer: PPO

## 2022-08-14 VITALS — BP 166/88 | HR 67 | Ht 64.0 in | Wt 122.0 lb

## 2022-08-14 DIAGNOSIS — M25511 Pain in right shoulder: Secondary | ICD-10-CM

## 2022-08-14 NOTE — Patient Instructions (Addendum)
Thank you for coming in today.   Please get an Xray today before you leave   I've referred you to Physical Therapy.  Let us know if you don't hear from them in one week.   You received an injection today. Seek immediate medical attention if the joint becomes red, extremely painful, or is oozing fluid.   Check back in 8 weeks

## 2022-08-16 NOTE — Progress Notes (Signed)
Right shoulder x-ray shows a little bit of arthritis at the top part of the shoulder.  Otherwise looks okay.

## 2022-09-11 NOTE — Progress Notes (Unsigned)
OUTPATIENT PHYSICAL THERAPY SHOULDER EVALUATION   Patient Name: Yvette Ferguson MRN: [id number] DOB:1956-04-18, 67 y.o., female Today's Date: 09/12/2022  END OF SESSION:  PT End of Session - 09/12/22 1145     Visit Number 1    Number of Visits 12    Date for PT Re-Evaluation 11/07/22    Authorization Type HTA    PT Start Time 1017    PT Stop Time 1100    PT Time Calculation (min) 43 min    Activity Tolerance Patient tolerated treatment well    Behavior During Therapy WFL for tasks assessed/performed             Past Medical History:  Diagnosis Date   Arthritis    Depression    GERD (gastroesophageal reflux disease)    Herpes labialis    Hyperlipidemia    Tremor    Past Surgical History:  Procedure Laterality Date   ABDOMINAL HYSTERECTOMY  2001   BREAST SURGERY  2000   eyelid surgery     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   Procedure: TOTAL HIP ARTHROPLASTY ANTERIOR APPROACH;  Surgeon: Durene Romans, MD;  Location: [facility];  Service: Orthopedics;  Laterality: Left;  70 mins   VAGINAL DELIVERY     x2   Patient Active Problem List   Diagnosis Date Noted   Ovarian cyst 05/09/2022   HTN (hypertension) 05/09/2022   Unintentional weight loss 11/22/2021   Dizziness after extension of neck 11/22/2021   Encounter for general adult medical examination with abnormal findings 11/22/2021   Epigastric pain 10/19/2021   Genital herpes simplex 10/19/2021   History of laparoscopic-assisted vaginal hysterectomy 10/19/2021   Nausea and vomiting 10/19/2021   Osteopenia 10/19/2021   Screening for diabetes mellitus 10/19/2021   Benign paroxysmal positional vertigo 10/19/2021   Mass of left foot 01/31/2021   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   S/P hip replacement, left 05/09/2020   Gastroesophageal reflux disease 03/29/2020   Pain of left hip joint 02/01/2020   Shoulder pain, left 01/05/2020   Anxiety 12/15/2018   Depression 04/28/2018   Herpes labialis  04/28/2018   Tremor 04/28/2018   Hyperlipidemia 04/28/2018   Right shoulder pain 04/28/2018   Menopause present 05/13/2012   Indigestion 05/13/2012    PCP: Elenore Paddy, NP  REFERRING PROVIDER: Rodolph Bong, MD  REFERRING DIAG: M25.511 (ICD-10-CM) - Acute pain of right shoulder  THERAPY DIAG:  Acute pain of right shoulder - Plan: PT plan of care cert/re-cert  Muscle weakness (generalized) - Plan: PT plan of care cert/re-cert  Rationale for Evaluation and Treatment: Rehabilitation  ONSET DATE: 08/07/22  SUBJECTIVE:  SUBJECTIVE STATEMENT: States that she has a history of right shoulder pain but she extended her right shoulder while vacuuming and she had a pop and it hurt. States she got an injection and it felt better but pain starting to come back. States it is not hurting as much but still aware of it. States she sleeps on her side and that sometimes bothers her.   HPI: Patient is a 67 year old female presenting with right shoulder pain ongoing since 2/28.  Patient was vacuuming and felt a "pop" in her right shoulder. Pt had a similar issue 15 years ago. Patient locates pain to the anterior aspect of her R shoulder.      Hand dominance: Right  PERTINENT HISTORY: L THA 2 years ago  PAIN:  Are you having pain? Yes: NPRS scale: 4/10 Pain location: right shoulder on the front  Pain description: dull achy, nauseating  Aggravating factors: using arm Relieving factors: shot   PRECAUTIONS: None  WEIGHT BEARING RESTRICTIONS: No  FALLS:  Has patient fallen in last 6 months? No     OCCUPATION: Cleans for living, paints for work, works in her garden  PLOF: Independent  PATIENT GOALS:to have less pain  NEXT MD VISIT:   OBJECTIVE:   DIAGNOSTIC FINDINGS:  Xray 08/14/22 FINDINGS: Moderate  AC joint degenerative changes. No fracture, dislocation, or bony lesion. No other abnormalities are identified.   IMPRESSION: Moderate AC joint degenerative changes.  PATIENT SURVEYS:  FOTO 54%  COGNITION: Overall cognitive status: Within functional limits for tasks assessed     SENSATION: Not tested  POSTURE: Forwards head, flat t spine, rounded shoulder R>L   Cervical AROM:    09/12/2022      Flexion  WFL     Extension  WFL*     R ROT  50% limited*     L ROT  50% limited*     R SB      L SB      * Pain/tightness   (Blank rows = not tested)      UE Measurements Upper Extremity Right 09/12/2022 Left 09/12/2022   A/PROM MMT A/PROM MMT  Shoulder Flexion WFL* 4 WFL 4  Shoulder Extension      Shoulder Abduction WFL* 4 WFL 4  Shoulder Adduction      Shoulder Internal Rotation  4  4  Shoulder External Rotation 60 prior* 90 (after manual) 4- WFL 4-  Elbow Flexion 45*  WFL   Elbow Extension      Wrist Flexion      Wrist Extension      Wrist Supination      Wrist Pronation      Wrist Ulnar Deviation      Wrist Radial Deviation      Grip Strength NA  NA     (Blank rows = not tested)   * pain     JOINT MOBILITY TESTING:  hypomobility  PALPATION:  Tenderness to palpation along R pecs, UT and shoulder ER   TODAY'S TREATMENT:  DATE:  09/12/2022  Therapeutic Exercise:  Aerobic: Supine: scapular protraction - not tolerated S/l: shoulder: ER - not tolerated well  Prone:  Seated:  Standing: at wall chin tuck 3 minutes, shoulder ER with RTB x15 B Neuromuscular Re-education: Manual Therapy: Therapeutic Activity: Self Care: Trigger Point Dry Needling:  Modalities:    PATIENT EDUCATION:  Education details: on current presentation, on HEP, on clinical outcomes score and POC Person educated: Patient Education method: Programmer, multimedia,  Demonstration, and Handouts Education comprehension: verbalized understanding   HOME EXERCISE PROGRAM: [id number]  ASSESSMENT:  CLINICAL IMPRESSION: Patient presents to physical therapy with complaints of right shoulder pain especially when reaching out to side and overhead.  Pain significantly better since recent injection but continues to bother her at times.  Patient demonstrates range of motion, strength, posture deficits that are likely contributing to current presentation and would greatly benefit from skilled physical therapy to reduce pain and improve overall quality of life and functional use of dominant arm.  OBJECTIVE IMPAIRMENTS: decreased ROM, decreased strength, impaired UE functional use, postural dysfunction, and pain.   ACTIVITY LIMITATIONS: carrying, lifting, and reach over head  PARTICIPATION LIMITATIONS: cleaning, occupation, and yard work  PERSONAL FACTORS: Fitness are also affecting patient's functional outcome.   REHAB POTENTIAL: Good  CLINICAL DECISION MAKING: Stable/uncomplicated  EVALUATION COMPLEXITY: Low   GOALS: Goals reviewed with patient? yes  SHORT TERM GOALS: Target date: 10/10/2022  Patient will be independent in self management strategies to improve quality of life and functional outcomes. Baseline: New Program Goal status: INITIAL  2.  Patient will report at least 50% improvement in overall symptoms and/or function to demonstrate improved functional mobility Baseline: 0% better Goal status: INITIAL  3.  Will demonstrate pain-free range of motion in right shoulder in all directions Baseline: Painful Goal status: INITIAL  4.  Patient will demonstrate at least 4 out of 5 MMT strength in shoulder external rotators Baseline: 4 - Goal status: INITIAL    LONG TERM GOALS: Target date: 11/07/2022   Patient will report at least 75% improvement in overall symptoms and/or function to demonstrate improved functional mobility Baseline: 0%  better Goal status: INITIAL  2.  Patient will improve score on FOTO outcomes measure to projected score to demonstrate overall improved function and QOL Baseline: see above Goal status: INITIAL  3.  Patient will be able to demonstrate good sitting and standing posture as well as good shoulder mechanics when reaching overhead Baseline:  Goal status: INITIAL     PLAN:  PT FREQUENCY: 1-2x/week for total of 12 visits over 8 week certification period  PT DURATION: 8 weeks  PLANNED INTERVENTIONS: Therapeutic exercises, Therapeutic activity, Neuromuscular re-education, Balance training, Gait training, Patient/Family education, Self Care, Joint mobilization, Joint manipulation, Vestibular training, Canalith repositioning, Orthotic/Fit training, DME instructions, Aquatic Therapy, Dry Needling, Electrical stimulation, Spinal manipulation, Spinal mobilization, Cryotherapy, Moist heat, Taping, Ionotophoresis 4mg /ml Dexamethasone, Manual therapy, and Re-evaluation  PLAN FOR NEXT SESSION: AP to shoulder, pec stretch/MMT, scap protraction, posture shoulder ER   12:02 PM, 09/12/22 Tereasa Coop, DPT Physical Therapy with [facility]

## 2022-09-12 ENCOUNTER — Encounter: Payer: Self-pay | Admitting: Physical Therapy

## 2022-09-12 ENCOUNTER — Ambulatory Visit: Payer: PPO | Admitting: Physical Therapy

## 2022-09-12 DIAGNOSIS — M25511 Pain in right shoulder: Secondary | ICD-10-CM

## 2022-09-12 DIAGNOSIS — M6281 Muscle weakness (generalized): Secondary | ICD-10-CM | POA: Diagnosis not present

## 2022-09-17 NOTE — Therapy (Unsigned)
OUTPATIENT PHYSICAL THERAPY TREATMENT NOTE   Patient Name: Yvette Ferguson MRN: [id number] DOB:08/06/1955, 67 y.o., female Today's Date: 09/17/2022  PCP: Elenore Paddy, NP   REFERRING PROVIDER: Rodolph Bong, MD  END OF SESSION:    Past Medical History:  Diagnosis Date   Arthritis    Depression    GERD (gastroesophageal reflux disease)    Herpes labialis    Hyperlipidemia    Tremor    Past Surgical History:  Procedure Laterality Date   ABDOMINAL HYSTERECTOMY  2001   BREAST SURGERY  2000   eyelid surgery     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   Procedure: TOTAL HIP ARTHROPLASTY ANTERIOR APPROACH;  Surgeon: Durene Romans, MD;  Location: [facility];  Service: Orthopedics;  Laterality: Left;  70 mins   VAGINAL DELIVERY     x2   Patient Active Problem List   Diagnosis Date Noted   Ovarian cyst 05/09/2022   HTN (hypertension) 05/09/2022   Unintentional weight loss 11/22/2021   Dizziness after extension of neck 11/22/2021   Encounter for general adult medical examination with abnormal findings 11/22/2021   Epigastric pain 10/19/2021   Genital herpes simplex 10/19/2021   History of laparoscopic-assisted vaginal hysterectomy 10/19/2021   Nausea and vomiting 10/19/2021   Osteopenia 10/19/2021   Screening for diabetes mellitus 10/19/2021   Benign paroxysmal positional vertigo 10/19/2021   Mass of left foot 01/31/2021   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   S/P hip replacement, left 05/09/2020   Gastroesophageal reflux disease 03/29/2020   Pain of left hip joint 02/01/2020   Shoulder pain, left 01/05/2020   Anxiety 12/15/2018   Depression 04/28/2018   Herpes labialis 04/28/2018   Tremor 04/28/2018   Hyperlipidemia 04/28/2018   Right shoulder pain 04/28/2018   Menopause present 05/13/2012   Indigestion 05/13/2012    THERAPY DIAG:  No diagnosis found.  REFERRING DIAG: M25.511 (ICD-10-CM) - Acute pain of right shoulder  Rationale for Evaluation and  Treatment: Rehabilitation   ONSET DATE: 08/07/22   SUBJECTIVE:                                                                                                                                                                                       SUBJECTIVE STATEMENT: 09/17/2022 ***  Eval: States that she has a history of right shoulder pain but she extended her right shoulder while vacuuming and she had a pop and it hurt. States she got an injection and it felt better but pain starting to come back. States it is not hurting as much but still aware of it. States she sleeps on her side and that sometimes bothers  her.    HPI: Patient is a 67 year old female presenting with right shoulder pain ongoing since 2/28.  Patient was vacuuming and felt a "pop" in her right shoulder. Pt had a similar issue 15 years ago. Patient locates pain to the anterior aspect of her R shoulder.       Hand dominance: Right   PERTINENT HISTORY: L THA 2 years ago   PAIN:  Are you having pain? Yes: NPRS scale: 4/10 Pain location: right shoulder on the front  Pain description: dull achy, nauseating  Aggravating factors: using arm Relieving factors: shot    PRECAUTIONS: None   WEIGHT BEARING RESTRICTIONS: No   FALLS:  Has patient fallen in last 6 months? No       OCCUPATION: Cleans for living, paints for work, works in her garden   PLOF: Independent   PATIENT GOALS:to have less pain   NEXT MD VISIT:    OBJECTIVE:    DIAGNOSTIC FINDINGS:  Xray 08/14/22 FINDINGS: Moderate AC joint degenerative changes. No fracture, dislocation, or bony lesion. No other abnormalities are identified.   IMPRESSION: Moderate AC joint degenerative changes.   PATIENT SURVEYS:  FOTO 54%   COGNITION: Overall cognitive status: Within functional limits for tasks assessed                                  SENSATION: Not tested   POSTURE: Forwards head, flat t spine, rounded shoulder R>L              Cervical AROM:     09/12/2022      Flexion  WFL     Extension  WFL*     R ROT  50% limited*     L ROT  50% limited*     R SB      L SB                            * Pain/tightness         (Blank rows = not tested)                  UE Measurements       Upper Extremity Right 09/12/2022 Left 09/12/2022    A/PROM MMT A/PROM MMT  Shoulder Flexion WFL* 4 WFL 4  Shoulder Extension          Shoulder Abduction WFL* 4 WFL 4  Shoulder Adduction          Shoulder Internal Rotation   4   4  Shoulder External Rotation 60 prior* 90 (after manual) 4- WFL 4-  Elbow Flexion 45*   WFL    Elbow Extension          Wrist Flexion          Wrist Extension          Wrist Supination          Wrist Pronation          Wrist Ulnar Deviation          Wrist Radial Deviation          Grip Strength NA   NA                          (Blank rows = not tested)                       *  pain         JOINT MOBILITY TESTING:  hypomobility   PALPATION:  Tenderness to palpation along R pecs, UT and shoulder ER             TODAY'S TREATMENT:                                                                                                                                         DATE:  09/17/2022    Therapeutic Exercise:    Aerobic: Supine: scapular protraction - not tolerated S/l: shoulder: ER - not tolerated well  Prone:    Seated:    Standing: at wall chin tuck 3 minutes, shoulder ER with RTB x15 B Neuromuscular Re-education: Manual Therapy: Therapeutic Activity: Self Care: Trigger Point Dry Needling:  Modalities:      PATIENT EDUCATION:  Education details: on current presentation, on HEP, on clinical outcomes score and POC Person educated: Patient Education method: Explanation, Demonstration, and Handouts Education comprehension: verbalized understanding     HOME EXERCISE PROGRAM: [id number]   ASSESSMENT:   CLINICAL IMPRESSION: 09/17/2022  Eval: Patient presents to physical therapy with complaints of right  shoulder pain especially when reaching out to side and overhead.  Pain significantly better since recent injection but continues to bother her at times.  Patient demonstrates range of motion, strength, posture deficits that are likely contributing to current presentation and would greatly benefit from skilled physical therapy to reduce pain and improve overall quality of life and functional use of dominant arm.   OBJECTIVE IMPAIRMENTS: decreased ROM, decreased strength, impaired UE functional use, postural dysfunction, and pain.    ACTIVITY LIMITATIONS: carrying, lifting, and reach over head   PARTICIPATION LIMITATIONS: cleaning, occupation, and yard work   PERSONAL FACTORS: Fitness are also affecting patient's functional outcome.    REHAB POTENTIAL: Good   CLINICAL DECISION MAKING: Stable/uncomplicated   EVALUATION COMPLEXITY: Low     GOALS: Goals reviewed with patient? yes   SHORT TERM GOALS: Target date: 10/10/2022  Patient will be independent in self management strategies to improve quality of life and functional outcomes. Baseline: New Program Goal status: INITIAL   2.  Patient will report at least 50% improvement in overall symptoms and/or function to demonstrate improved functional mobility Baseline: 0% better Goal status: INITIAL   3.  Will demonstrate pain-free range of motion in right shoulder in all directions Baseline: Painful Goal status: INITIAL   4.  Patient will demonstrate at least 4 out of 5 MMT strength in shoulder external rotators Baseline: 4 - Goal status: INITIAL       LONG TERM GOALS: Target date: 11/07/2022    Patient will report at least 75% improvement in overall symptoms and/or function to demonstrate improved functional mobility Baseline: 0% better Goal status: INITIAL   2.  Patient will improve score on FOTO outcomes measure to projected score to demonstrate overall improved function  and QOL Baseline: see above Goal status: INITIAL   3.   Patient will be able to demonstrate good sitting and standing posture as well as good shoulder mechanics when reaching overhead Baseline:  Goal status: INITIAL         PLAN:   PT FREQUENCY: 1-2x/week for total of 12 visits over 8 week certification period   PT DURATION: 8 weeks   PLANNED INTERVENTIONS: Therapeutic exercises, Therapeutic activity, Neuromuscular re-education, Balance training, Gait training, Patient/Family education, Self Care, Joint mobilization, Joint manipulation, Vestibular training, Canalith repositioning, Orthotic/Fit training, DME instructions, Aquatic Therapy, Dry Needling, Electrical stimulation, Spinal manipulation, Spinal mobilization, Cryotherapy, Moist heat, Taping, Ionotophoresis 4mg /ml Dexamethasone, Manual therapy, and Re-evaluation   PLAN FOR NEXT SESSION: AP to shoulder, pec stretch/MMT, scap protraction, posture shoulder ER    1:28 PM, 09/17/22 Tereasa Coop, DPT Physical Therapy with [facility]

## 2022-09-18 ENCOUNTER — Ambulatory Visit (INDEPENDENT_AMBULATORY_CARE_PROVIDER_SITE_OTHER): Payer: PPO | Admitting: Physical Therapy

## 2022-09-18 ENCOUNTER — Encounter: Payer: Self-pay | Admitting: Physical Therapy

## 2022-09-18 DIAGNOSIS — M25511 Pain in right shoulder: Secondary | ICD-10-CM

## 2022-09-18 DIAGNOSIS — M6281 Muscle weakness (generalized): Secondary | ICD-10-CM | POA: Diagnosis not present

## 2022-09-25 ENCOUNTER — Encounter: Payer: PPO | Admitting: Physical Therapy

## 2022-09-29 ENCOUNTER — Other Ambulatory Visit: Payer: Self-pay | Admitting: Nurse Practitioner

## 2022-10-02 ENCOUNTER — Encounter: Payer: Self-pay | Admitting: Physical Therapy

## 2022-10-02 ENCOUNTER — Ambulatory Visit (INDEPENDENT_AMBULATORY_CARE_PROVIDER_SITE_OTHER): Payer: PPO | Admitting: Physical Therapy

## 2022-10-02 DIAGNOSIS — M25511 Pain in right shoulder: Secondary | ICD-10-CM | POA: Diagnosis not present

## 2022-10-02 DIAGNOSIS — M6281 Muscle weakness (generalized): Secondary | ICD-10-CM

## 2022-10-02 NOTE — Therapy (Addendum)
OUTPATIENT PHYSICAL THERAPY TREATMENT NOTE PHYSICAL THERAPY DISCHARGE SUMMARY  Visits from Start of Care: 3  Current functional level related to goals / functional outcomes: Per MD note doing better with PT   Remaining deficits: Per MD not doing better after PT   Education / Equipment: Unable to assess due to unplanned discharge    Patient agrees to discharge. Patient goals were not met. Patient is being discharged due to not returning since the last visit.  8:39 AM, 12/05/22 Tereasa Coop, DPT Physical Therapy with [place]    Patient Name: Yvette Ferguson MRN: [id number] DOB:07/16/1955, 67 y.o., female Today's Date: 10/02/2022  PCP: Elenore Paddy, NP   REFERRING PROVIDER: Rodolph Bong, MD  END OF SESSION:   PT End of Session - 10/02/22 0848     Visit Number 3    Number of Visits 12    Date for PT Re-Evaluation 11/07/22    Authorization Type HTA    PT Start Time 0851    PT Stop Time 0929    PT Time Calculation (min) 38 min    Activity Tolerance Patient tolerated treatment well    Behavior During Therapy [facility] for tasks assessed/performed             Past Medical History:  Diagnosis Date   Arthritis    Depression    GERD (gastroesophageal reflux disease)    Herpes labialis    Hyperlipidemia    Tremor    Past Surgical History:  Procedure Laterality Date   ABDOMINAL HYSTERECTOMY  2001   BREAST SURGERY  2000   eyelid surgery     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   Procedure: TOTAL HIP ARTHROPLASTY ANTERIOR APPROACH;  Surgeon: Durene Romans, MD;  Location: [facility];  Service: Orthopedics;  Laterality: Left;  70 mins   VAGINAL DELIVERY     x2   Patient Active Problem List   Diagnosis Date Noted   Ovarian cyst 05/09/2022   HTN (hypertension) 05/09/2022   Unintentional weight loss 11/22/2021   Dizziness after extension of neck 11/22/2021   Encounter for general adult medical examination with abnormal findings 11/22/2021   Epigastric  pain 10/19/2021   Genital herpes simplex 10/19/2021   History of laparoscopic-assisted vaginal hysterectomy 10/19/2021   Nausea and vomiting 10/19/2021   Osteopenia 10/19/2021   Screening for diabetes mellitus 10/19/2021   Benign paroxysmal positional vertigo 10/19/2021   Mass of left foot 01/31/2021   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   S/P hip replacement, left 05/09/2020   Gastroesophageal reflux disease 03/29/2020   Pain of left hip joint 02/01/2020   Shoulder pain, left 01/05/2020   Anxiety 12/15/2018   Depression 04/28/2018   Herpes labialis 04/28/2018   Tremor 04/28/2018   Hyperlipidemia 04/28/2018   Right shoulder pain 04/28/2018   Menopause present 05/13/2012   Indigestion 05/13/2012    THERAPY DIAG:  Acute pain of right shoulder  Muscle weakness (generalized)  REFERRING DIAG: M25.511 (ICD-10-CM) - Acute pain of right shoulder  Rationale for Evaluation and Treatment: Rehabilitation   ONSET DATE: 08/07/22   SUBJECTIVE:  SUBJECTIVE STATEMENT: 10/02/2022 States that she doesn't know if she has been doing her standing exercise correctly and wants to review it. Overall feeling good. States her shoulder is not popping as much. States she still has some tightness but not as much pain.   Eval: States that she has a history of right shoulder pain but she extended her right shoulder while vacuuming and she had a pop and it hurt. States she got an injection and it felt better but pain starting to come back. States it is not hurting as much but still aware of it. States she sleeps on her side and that sometimes bothers her.    HPI: Patient is a 67 year old female presenting with right shoulder pain ongoing since 2/28.  Patient was vacuuming and felt a "pop" in her right shoulder. Pt had a  similar issue 15 years ago. Patient locates pain to the anterior aspect of her R shoulder.       Hand dominance: Right   PERTINENT HISTORY: L THA 2 years ago   PAIN:  Are you having pain? Yes: NPRS scale: 0/10 Pain location: right shoulder on the front  Pain description: just soreness Aggravating factors: using arm Relieving factors: shot    PRECAUTIONS: None   WEIGHT BEARING RESTRICTIONS: No   FALLS:  Has patient fallen in last 6 months? No       OCCUPATION: Cleans for living, paints for work, works in her garden   PLOF: Independent   PATIENT GOALS:to have less pain   NEXT MD VISIT:    OBJECTIVE:    DIAGNOSTIC FINDINGS:  Xray 08/14/22 FINDINGS: Moderate AC joint degenerative changes. No fracture, dislocation, or bony lesion. No other abnormalities are identified.   IMPRESSION: Moderate AC joint degenerative changes.   PATIENT SURVEYS:  FOTO 54%   COGNITION: Overall cognitive status: Within functional limits for tasks assessed                                  SENSATION: Not tested   POSTURE: Forwards head, flat t spine, rounded shoulder R>L              Cervical AROM:    09/12/2022      Flexion  WFL     Extension  WFL*     R ROT  50% limited*     L ROT  50% limited*     R SB      L SB                            * Pain/tightness         (Blank rows = not tested)                  UE Measurements       Upper Extremity Right 09/12/2022 Left 09/12/2022    A/PROM MMT A/PROM MMT  Shoulder Flexion WFL* 4 WFL 4  Shoulder Extension          Shoulder Abduction WFL* 4 WFL 4  Shoulder Adduction          Shoulder Internal Rotation   4   4  Shoulder External Rotation 60 prior* 90 (after manual) 4- WFL 4-  Elbow Flexion 45*   WFL    Elbow Extension          Wrist Flexion  Wrist Extension          Wrist Supination          Wrist Pronation          Wrist Ulnar Deviation          Wrist Radial Deviation          Grip Strength NA   NA                           (Blank rows = not tested)                       * pain         JOINT MOBILITY TESTING:  hypomobility   PALPATION:  Tenderness to palpation along R pecs, UT and shoulder ER             TODAY'S TREATMENT:                                                                                                                                         DATE:  10/02/2022    Therapeutic Exercise:    Aerobic: Supine: laying on towel 3 minutes, then shoulder flexion 2x10 B  then shoulder horizontal abd  2x10 B S/l:   Prone:scapular retraction and protraction 3x10 B 5" holds, row unilateral 2x15 L slow and controlled, shoulder ext R 2x10 (row too difficult)    quad:      Standing: shoulder extension and IR with dowel stretch 2.5 minutes total, pec stretch x10 10" holds back at wall, shoulder horizontal abd with towel roll down back RTB 3x8 B   Neuromuscular Re-education: Manual Therapy: Therapeutic Activity: Self Care: Trigger Point Dry Needling:  Modalities:      PATIENT EDUCATION:  Education details: on HEP Person educated: Patient Education method: Explanation, Facilities manager, and Handouts Education comprehension: verbalized understanding     HOME EXERCISE PROGRAM: [id number]   ASSESSMENT:   CLINICAL IMPRESSION: 10/02/2022 Progressed exercises and all were tolerated well on this date. Added new exercises to HEP and answered all questions about current HEP. Able to add thoracic mobilization dn prone scapular strengthening. No pain noted during session but soreness in muscles noted end of session. Patient will continue to benefit from skilled PT at this time.  Eval: Patient presents to physical therapy with complaints of right shoulder pain especially when reaching out to side and overhead.  Pain significantly better since recent injection but continues to bother her at times.  Patient demonstrates range of motion, strength, posture deficits that are likely contributing to current  presentation and would greatly benefit from skilled physical therapy to reduce pain and improve overall quality of life and functional use of dominant arm.   OBJECTIVE IMPAIRMENTS: decreased ROM, decreased strength, impaired UE functional use, postural dysfunction, and pain.    ACTIVITY LIMITATIONS: carrying, lifting,  and reach over head   PARTICIPATION LIMITATIONS: cleaning, occupation, and yard work   PERSONAL FACTORS: Fitness are also affecting patient's functional outcome.    REHAB POTENTIAL: Good   CLINICAL DECISION MAKING: Stable/uncomplicated   EVALUATION COMPLEXITY: Low     GOALS: Goals reviewed with patient? yes   SHORT TERM GOALS: Target date: 10/10/2022  Patient will be independent in self management strategies to improve quality of life and functional outcomes. Baseline: New Program Goal status: INITIAL   2.  Patient will report at least 50% improvement in overall symptoms and/or function to demonstrate improved functional mobility Baseline: 0% better Goal status: INITIAL   3.  Will demonstrate pain-free range of motion in right shoulder in all directions Baseline: Painful Goal status: INITIAL   4.  Patient will demonstrate at least 4 out of 5 MMT strength in shoulder external rotators Baseline: 4 - Goal status: INITIAL       LONG TERM GOALS: Target date: 11/07/2022    Patient will report at least 75% improvement in overall symptoms and/or function to demonstrate improved functional mobility Baseline: 0% better Goal status: INITIAL   2.  Patient will improve score on FOTO outcomes measure to projected score to demonstrate overall improved function and QOL Baseline: see above Goal status: INITIAL   3.  Patient will be able to demonstrate good sitting and standing posture as well as good shoulder mechanics when reaching overhead Baseline:  Goal status: INITIAL         PLAN:   PT FREQUENCY: 1-2x/week for total of 12 visits over 8 week certification  period   PT DURATION: 8 weeks   PLANNED INTERVENTIONS: Therapeutic exercises, Therapeutic activity, Neuromuscular re-education, Balance training, Gait training, Patient/Family education, Self Care, Joint mobilization, Joint manipulation, Vestibular training, Canalith repositioning, Orthotic/Fit training, DME instructions, Aquatic Therapy, Dry Needling, Electrical stimulation, Spinal manipulation, Spinal mobilization, Cryotherapy, Moist heat, Taping, Ionotophoresis 4mg /ml Dexamethasone, Manual therapy, and Re-evaluation   PLAN FOR NEXT SESSION: AP to shoulder, pec stretch/MMT, scap protraction, posture shoulder ER    9:42 AM, 10/02/22 Tereasa Coop, DPT Physical Therapy with [facility]

## 2022-10-08 NOTE — Progress Notes (Unsigned)
   Rubin Payor, PhD, LAT, ATC acting as a scribe for Clementeen Graham, MD.  Yvette Ferguson is a 67 y.o. female who presents to [facility] at [facility] today for f/u R shoulder pain. She felt a "pop" in her R shoulder when vacuuming. Pt was last seen by Dr. Denyse Amass on 08/14/22 and was given a R subacromial steroid injection and was referred to PT, completing 3 visits. Today, pt reports ***  Dx imaging: 08/14/22 R shoulder XR  Pertinent review of systems: ***  Relevant historical information: ***   Exam:  There were no vitals taken for this visit. General: Well Developed, well nourished, and in no acute distress.   MSK: ***    Lab and Radiology Results No results found for this or any previous visit (from the past 72 hour(s)). No results found.     Assessment and Plan: 67 y.o. female with ***   PDMP not reviewed this encounter. No orders of the defined types were placed in this encounter.  No orders of the defined types were placed in this encounter.    Discussed warning signs or symptoms. Please see discharge instructions. Patient expresses understanding.   ***

## 2022-10-09 ENCOUNTER — Ambulatory Visit (INDEPENDENT_AMBULATORY_CARE_PROVIDER_SITE_OTHER): Payer: PPO | Admitting: Family Medicine

## 2022-10-09 ENCOUNTER — Encounter: Payer: Self-pay | Admitting: Family Medicine

## 2022-10-09 VITALS — BP 118/80 | HR 62 | Ht 64.0 in | Wt 122.8 lb

## 2022-10-09 DIAGNOSIS — M25511 Pain in right shoulder: Secondary | ICD-10-CM

## 2022-10-09 NOTE — Patient Instructions (Signed)
Thank you for coming in today.   Keep up the home exercises.   If you need a re-try of PT let me know.   We can repeat the injection in the future if needed (every 3 months if needed).

## 2022-10-22 ENCOUNTER — Other Ambulatory Visit: Payer: Self-pay | Admitting: Nurse Practitioner

## 2022-11-12 NOTE — Progress Notes (Signed)
Spoke with patient, she has called office to reschedule, awaiting return call.

## 2022-12-02 ENCOUNTER — Ambulatory Visit (HOSPITAL_BASED_OUTPATIENT_CLINIC_OR_DEPARTMENT_OTHER): Admission: RE | Admit: 2022-12-02 | Payer: PPO | Source: Home / Self Care | Admitting: Obstetrics and Gynecology

## 2022-12-02 SURGERY — SALPINGO-OOPHORECTOMY, OPEN
Anesthesia: General | Laterality: Bilateral

## 2022-12-26 ENCOUNTER — Other Ambulatory Visit: Payer: Self-pay | Admitting: Nurse Practitioner

## 2023-01-07 ENCOUNTER — Other Ambulatory Visit: Payer: Self-pay | Admitting: Nurse Practitioner

## 2023-01-07 DIAGNOSIS — B001 Herpesviral vesicular dermatitis: Secondary | ICD-10-CM

## 2023-01-08 ENCOUNTER — Encounter (INDEPENDENT_AMBULATORY_CARE_PROVIDER_SITE_OTHER): Payer: Self-pay

## 2023-01-30 ENCOUNTER — Other Ambulatory Visit: Payer: Self-pay | Admitting: Nurse Practitioner

## 2023-01-30 DIAGNOSIS — B001 Herpesviral vesicular dermatitis: Secondary | ICD-10-CM

## 2023-02-06 ENCOUNTER — Ambulatory Visit (INDEPENDENT_AMBULATORY_CARE_PROVIDER_SITE_OTHER): Payer: PPO | Admitting: Nurse Practitioner

## 2023-02-06 VITALS — BP 130/70 | HR 64 | Temp 98.0°F | Ht 64.0 in | Wt 124.1 lb

## 2023-02-06 DIAGNOSIS — E785 Hyperlipidemia, unspecified: Secondary | ICD-10-CM | POA: Diagnosis not present

## 2023-02-06 DIAGNOSIS — B001 Herpesviral vesicular dermatitis: Secondary | ICD-10-CM

## 2023-02-06 LAB — COMPREHENSIVE METABOLIC PANEL
ALT: 26 U/L (ref 0–35)
AST: 30 U/L (ref 0–37)
Albumin: 4.3 g/dL (ref 3.5–5.2)
Alkaline Phosphatase: 44 U/L (ref 39–117)
BUN: 19 mg/dL (ref 6–23)
CO2: 28 mEq/L (ref 19–32)
Calcium: 9.2 mg/dL (ref 8.4–10.5)
Chloride: 103 mEq/L (ref 96–112)
Creatinine, Ser: 0.84 mg/dL (ref 0.40–1.20)
GFR: 72.76 mL/min (ref >60.00)
Glucose, Bld: 88 mg/dL (ref 70–99)
Potassium: 4.1 mEq/L (ref 3.5–5.1)
Sodium: 139 mEq/L (ref 135–145)
Total Bilirubin: 0.5 mg/dL (ref 0.2–1.2)
Total Protein: 6.8 g/dL (ref 6.0–8.3)

## 2023-02-06 LAB — CBC
HCT: 39.8 % (ref 36.0–46.0)
Hemoglobin: 13.3 g/dL (ref 12.0–15.0)
MCHC: 33.4 g/dL (ref 30.0–36.0)
MCV: 101 fl — ABNORMAL HIGH (ref 78.0–100.0)
Platelets: 229 10*3/uL (ref 150.0–400.0)
RBC: 3.94 Mil/uL (ref 3.87–5.11)
RDW: 14.1 % (ref 11.5–15.5)
WBC: 6.5 10*3/uL (ref 4.0–10.5)

## 2023-02-06 LAB — LIPID PANEL
Cholesterol: 226 mg/dL — ABNORMAL HIGH (ref 0–200)
HDL: 68.1 mg/dL (ref >39.00)
LDL Cholesterol: 145 mg/dL — ABNORMAL HIGH (ref 0–99)
NonHDL: 157.62
Total CHOL/HDL Ratio: 3
Triglycerides: 63 mg/dL (ref 0.0–149.0)
VLDL: 12.6 mg/dL (ref 0.0–40.0)

## 2023-02-06 MED ORDER — VALACYCLOVIR HCL 1 G PO TABS
ORAL_TABLET | ORAL | 3 refills | Status: DC
Start: 2023-02-06 — End: 2023-10-28

## 2023-02-06 NOTE — Progress Notes (Signed)
Established Patient Office Visit  Subjective   Patient ID: Yvette Ferguson, female    DOB: [birth year]  Age: 67 y.o. 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 Has history of oral and ophthalmic infection.  HLD: on zetia as monotherapy. Due for updated fasting lipid panel today    Review of Systems  Respiratory:  Negative for shortness of breath.   Cardiovascular:  Negative for chest pain.      Objective:     BP 130/70   Pulse 64   Temp 98 F (36.7 C) (Temporal)   Ht 5\' 4"  (1.626 m)   Wt 124 lb 2 oz (56.3 kg)   SpO2 97%   BMI 21.31 kg/m  BP Readings from Last 3 Encounters:  02/06/23 130/70  10/09/22 118/80  08/14/22 (!) 166/88   Wt Readings from Last 3 Encounters:  02/06/23 124 lb 2 oz (56.3 kg)  10/09/22 122 lb 12.8 oz (55.7 kg)  08/14/22 122 lb (55.3 kg)      Physical Exam Vitals reviewed.  Constitutional:      General: She is not in acute distress.    Appearance: Normal appearance.  HENT:     Head: Normocephalic and atraumatic.  Neck:     Vascular: No carotid bruit.  Cardiovascular:     Rate and Rhythm: Normal rate and regular rhythm.     Pulses: Normal pulses.     Heart sounds: Normal heart sounds.  Pulmonary:     Effort: Pulmonary effort is normal.     Breath sounds: Normal breath sounds.  Skin:    General: Skin is warm and dry.  Neurological:     General: No focal deficit present.     Mental Status: She is alert and oriented to person, place, and time.  Psychiatric:        Mood and Affect: Mood normal.        Behavior: Behavior normal.        Judgment: Judgment normal.      No results found for any visits on 02/06/23.    The 10-year ASCVD risk score (Arnett DK, et al., 2019) is: 5.3%    Assessment & Plan:   Problem List Items Addressed This Visit       Digestive   Herpes labialis    Chronic,  intermittent Continue on valacyclovir 1g/day as suppressive therapy. Check CMP today, further recommendations may be made based on these results.       Relevant Medications   valACYclovir (VALTREX) 1000 MG tablet     Other   Hyperlipidemia - Primary    Chronic, Check fasting lipid panel today Continue zetia 10mg /day Further recommendations may be made based on results.       Relevant Orders   Lipid panel   Comprehensive metabolic panel   CBC    Return in about 1 year (around 02/06/2024) for F/U with Nathyn Luiz.    Elenore Paddy, NP

## 2023-02-06 NOTE — Assessment & Plan Note (Signed)
Chronic, Check fasting lipid panel today Continue zetia 10mg /day Further recommendations may be made based on results.

## 2023-02-06 NOTE — Assessment & Plan Note (Signed)
Chronic, intermittent Continue on valacyclovir 1g/day as suppressive therapy. Check CMP today, further recommendations may be made based on these results.

## 2023-02-11 ENCOUNTER — Encounter: Payer: Self-pay | Admitting: Obstetrics and Gynecology

## 2023-03-19 ENCOUNTER — Encounter (HOSPITAL_BASED_OUTPATIENT_CLINIC_OR_DEPARTMENT_OTHER): Payer: Self-pay | Admitting: Obstetrics and Gynecology

## 2023-03-20 ENCOUNTER — Encounter (HOSPITAL_BASED_OUTPATIENT_CLINIC_OR_DEPARTMENT_OTHER): Payer: Self-pay | Admitting: Obstetrics and Gynecology

## 2023-03-20 NOTE — Progress Notes (Addendum)
Spoke w/ via phone for pre-op interview--- pt Lab needs dos----  cbc, t&s       Lab results------ no COVID test -----patient states asymptomatic no test needed Arrive at ------- 0530 on 03-31-2023 NPO after MN NO Solid Food.  Clear liquids from MN until--- 0430 Med rec completed Medications to take morning of surgery ----- none Diabetic medication ----- n/a Patient instructed no nail polish to be worn day of surgery Patient instructed to bring photo id and insurance card day of surgery Patient aware to have Driver (ride ) / caregiver    for 24 hours after surgery - friend, matt  Patient Special Instructions ----- reviewed [facility] and visitor guidelines  Pre-Op special Instructions ----- n/a Patient verbalized understanding of instructions that were given at this phone interview. Patient denies chest pain, sob, fever, cough at the interview.

## 2023-03-21 NOTE — H&P (Signed)
NAME: Yvette, Ferguson MEDICAL RECORD NO: [id number] ACCOUNT NO: [id number] DATE OF BIRTH: 11-24-1955 PHYSICIAN: Duke Salvia. Marcelle Overlie, MD  History and Physical   DATE OF ADMISSION: 03/31/2023  Surgery scheduled at [facility] on 10/21.  CHIEF COMPLAINT:  Pelvic pain, bilateral ovarian cyst.  HISTORY OF PRESENT ILLNESS:  A 67 year old G2 P2 who has had a prior hysterectomy, has been followed for bilateral ovarian cysts with some increased pelvic pressure and discomfort.  As part of her evaluation, ultrasound a year ago 10/23 documented 6.5 x  5.1 complex cyst on the right, on the left 3.1 x 3.0.  No free fluid noted.  On 12/23 had a CT done that showed similar findings 6.4 complex cyst, right, 3.7 left. Pelvic MRI was recommended at that point.  On 10/23 CA-125 was 8.3.  MRI was done 2/24 that showed a 6.2 x 5.4 x 5.8 complex multicystic lesion right ovary, left 3.5 x  3.0 x 3.4.  No irregular wall or septal thickening.  No suspicious enhancement or papillary projections. 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  Presents at this time for laparotomy with BSO.  This procedure including specific risks regarding bleeding, infection, transfusion, wound infection, phlebitis along with her expected recovery time, possible need for further surgery, adjacent organ  injury, all reviewed with her, which she understands and accepts.  PAST MEDICAL HISTORY: ALLERGIES:  None.  CURRENT MEDICATIONS:  Wellbutrin 150 once daily, Lexapro 10 mg once daily, *** 10 mg once daily, valacyclovir p.r.n., Xanax p.r.n.  OBSTETRICAL HISTORY:  OB history, she is a G2 P2.  FAMILY HISTORY:  Mother and sister both had pancreatic cancer, mother and father both with diabetes.  Her father has had an MI and congestive heart failure.  SOCIAL HISTORY:  She is divorced.  She is a regular exerciser, nonsmoker, moderate alcohol use.  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  PHYSICAL EXAMINATION:   VITAL SIGNS:  Temperature 98.2, blood pressure 120/68. HEENT:  Unremarkable. NECK:  Supple, without masses. LUNGS:  Clear. CARDIOVASCULAR:  Regular rate and rhythm without murmurs, rubs or gallops. BREASTS:  Without masses, tenderness, or nipple discharge. ABDOMEN:  Soft, flat, nontender. PELVIC:  Vulva, vagina, vaginal cuff normal, bilaterally.  Slightly full both sides.  No definite mass was noted, however. EXTREMITIES:  Unremarkable. NEUROLOGIC:  Unremarkable.  IMPRESSION:  Bilateral ovarian cysts, imaging and CA-125 results as noted above.  PLAN:  Laparotomy with BSO.  Procedure and risks discussed as above.   PUS D: 03/19/2023 9:42:26 am T: 03/19/2023 11:29:00 am  JOB: [id number]/ [id number]

## 2023-03-24 DIAGNOSIS — N83209 Unspecified ovarian cyst, unspecified side: Secondary | ICD-10-CM | POA: Diagnosis not present

## 2023-03-25 ENCOUNTER — Other Ambulatory Visit: Payer: Self-pay | Admitting: Nurse Practitioner

## 2023-03-25 DIAGNOSIS — E785 Hyperlipidemia, unspecified: Secondary | ICD-10-CM

## 2023-03-30 NOTE — Plan of Care (Signed)
[facility] Tonsillectomy/Adenoidectomy, Postoperative PEDS care plan entered in error.

## 2023-03-31 ENCOUNTER — Observation Stay (HOSPITAL_BASED_OUTPATIENT_CLINIC_OR_DEPARTMENT_OTHER)
Admission: RE | Admit: 2023-03-31 | Discharge: 2023-04-01 | Disposition: A | Discharge status: Home / Self Care | Payer: PPO | Attending: Obstetrics and Gynecology | Admitting: Obstetrics and Gynecology

## 2023-03-31 ENCOUNTER — Encounter (HOSPITAL_BASED_OUTPATIENT_CLINIC_OR_DEPARTMENT_OTHER): Payer: Self-pay | Admitting: Obstetrics and Gynecology

## 2023-03-31 ENCOUNTER — Other Ambulatory Visit: Payer: Self-pay

## 2023-03-31 ENCOUNTER — Encounter (HOSPITAL_BASED_OUTPATIENT_CLINIC_OR_DEPARTMENT_OTHER)
Admission: RE | Disposition: A | Discharge status: Home / Self Care | Payer: Self-pay | Source: Home / Self Care | Attending: Obstetrics and Gynecology

## 2023-03-31 ENCOUNTER — Observation Stay (HOSPITAL_BASED_OUTPATIENT_CLINIC_OR_DEPARTMENT_OTHER): Payer: PPO | Admitting: Certified Registered Nurse Anesthetist

## 2023-03-31 DIAGNOSIS — N83202 Unspecified ovarian cyst, left side: Secondary | ICD-10-CM

## 2023-03-31 DIAGNOSIS — N83201 Unspecified ovarian cyst, right side: Secondary | ICD-10-CM

## 2023-03-31 DIAGNOSIS — D271 Benign neoplasm of left ovary: Secondary | ICD-10-CM | POA: Diagnosis not present

## 2023-03-31 DIAGNOSIS — Z90710 Acquired absence of both cervix and uterus: Secondary | ICD-10-CM | POA: Diagnosis not present

## 2023-03-31 DIAGNOSIS — Z01818 Encounter for other preprocedural examination: Secondary | ICD-10-CM

## 2023-03-31 DIAGNOSIS — D270 Benign neoplasm of right ovary: Secondary | ICD-10-CM | POA: Diagnosis not present

## 2023-03-31 DIAGNOSIS — Z515 Encounter for palliative care: Secondary | ICD-10-CM

## 2023-03-31 HISTORY — PX: SALPINGOOPHORECTOMY: SHX82

## 2023-03-31 HISTORY — DX: Personal history of other diseases of the digestive system: Z87.19

## 2023-03-31 HISTORY — DX: Anxiety disorder, unspecified: F41.9

## 2023-03-31 HISTORY — DX: Presence of spectacles and contact lenses: Z97.3

## 2023-03-31 HISTORY — DX: Unspecified ovarian cyst, right side: N83.201

## 2023-03-31 HISTORY — DX: Essential tremor: G25.0

## 2023-03-31 HISTORY — DX: Unspecified ovarian cyst, right side: N83.202

## 2023-03-31 HISTORY — DX: Unspecified osteoarthritis, unspecified site: M19.90

## 2023-03-31 LAB — TYPE AND SCREEN
ABO/RH(D): A POS
Antibody Screen: NEGATIVE

## 2023-03-31 LAB — CBC
HCT: 38.3 % (ref 36.0–46.0)
Hemoglobin: 13 g/dL (ref 12.0–15.0)
MCH: 34.2 pg — ABNORMAL HIGH (ref 26.0–34.0)
MCHC: 33.9 g/dL (ref 30.0–36.0)
MCV: 100.8 fL — ABNORMAL HIGH (ref 80.0–100.0)
Platelets: 214 10*3/uL (ref 150–400)
RBC: 3.8 MIL/uL — ABNORMAL LOW (ref 3.87–5.11)
RDW: 12.9 % (ref 11.5–15.5)
WBC: 6.1 10*3/uL (ref 4.0–10.5)
nRBC: 0 % (ref 0.0–0.2)

## 2023-03-31 SURGERY — SALPINGO-OOPHORECTOMY, OPEN
Anesthesia: General | Site: Abdomen | Laterality: Bilateral

## 2023-03-31 MED ORDER — GABAPENTIN 300 MG PO CAPS
ORAL_CAPSULE | ORAL | Status: AC
  Filled 2023-03-31: qty 1

## 2023-03-31 MED ORDER — ONDANSETRON HCL 4 MG/2ML IJ SOLN: 4.0000 mg | Freq: Once | INTRAMUSCULAR | Status: DC | PRN

## 2023-03-31 MED ORDER — ROCURONIUM BROMIDE 10 MG/ML (PF) SYRINGE
PREFILLED_SYRINGE | INTRAVENOUS | Status: DC | PRN
  Administered 2023-03-31: 40 mg via INTRAVENOUS
  Administered 2023-03-31: 10 mg via INTRAVENOUS

## 2023-03-31 MED ORDER — FENTANYL CITRATE (PF) 100 MCG/2ML IJ SOLN
INTRAMUSCULAR | Status: DC | PRN
  Administered 2023-03-31: 50 ug via INTRAVENOUS
  Administered 2023-03-31: 100 ug via INTRAVENOUS

## 2023-03-31 MED ORDER — GABAPENTIN 100 MG PO CAPS
ORAL_CAPSULE | ORAL | Status: AC
  Filled 2023-03-31: qty 2

## 2023-03-31 MED ORDER — ACETAMINOPHEN 160 MG/5ML PO SOLN: 325.0000 mg | ORAL | Status: DC | PRN

## 2023-03-31 MED ORDER — ROCURONIUM BROMIDE 10 MG/ML (PF) SYRINGE
PREFILLED_SYRINGE | INTRAVENOUS | Status: AC
  Filled 2023-03-31: qty 10

## 2023-03-31 MED ORDER — BUPIVACAINE LIPOSOME 1.3 % IJ SUSP
INTRAMUSCULAR | Status: DC | PRN
  Administered 2023-03-31: 30 mL via SURGICAL_CAVITY

## 2023-03-31 MED ORDER — LIDOCAINE 2% (20 MG/ML) 5 ML SYRINGE
INTRAMUSCULAR | Status: DC | PRN
  Administered 2023-03-31: 100 mg via INTRAVENOUS

## 2023-03-31 MED ORDER — DEXAMETHASONE SODIUM PHOSPHATE 10 MG/ML IJ SOLN
INTRAMUSCULAR | Status: AC
  Filled 2023-03-31: qty 1

## 2023-03-31 MED ORDER — KETOROLAC TROMETHAMINE 30 MG/ML IJ SOLN
INTRAMUSCULAR | Status: DC | PRN
  Administered 2023-03-31: 30 mg via INTRAVENOUS

## 2023-03-31 MED ORDER — OXYCODONE HCL 5 MG/5ML PO SOLN: 5.0000 mg | Freq: Once | ORAL | Status: DC | PRN

## 2023-03-31 MED ORDER — 0.9 % SODIUM CHLORIDE (POUR BTL) OPTIME
TOPICAL | Status: DC | PRN
Start: 2023-03-31 — End: 2023-03-31
  Administered 2023-03-31: 500 mL
  Administered 2023-03-31: 2000 mL

## 2023-03-31 MED ORDER — GABAPENTIN 300 MG PO CAPS
300.0000 mg | ORAL_CAPSULE | ORAL | Status: AC
  Administered 2023-03-31: 300 mg via ORAL

## 2023-03-31 MED ORDER — SUGAMMADEX SODIUM 200 MG/2ML IV SOLN
INTRAVENOUS | Status: DC | PRN
  Administered 2023-03-31: 200 mg via INTRAVENOUS

## 2023-03-31 MED ORDER — EPHEDRINE 5 MG/ML INJ
INTRAVENOUS | Status: AC
  Filled 2023-03-31: qty 5

## 2023-03-31 MED ORDER — IBUPROFEN 200 MG PO TABS: 600.0000 mg | ORAL_TABLET | Freq: Four times a day (QID) | ORAL | Status: DC

## 2023-03-31 MED ORDER — ACETAMINOPHEN 325 MG PO TABS: 325.0000 mg | ORAL_TABLET | ORAL | Status: DC | PRN

## 2023-03-31 MED ORDER — KETOROLAC TROMETHAMINE 30 MG/ML IJ SOLN
INTRAMUSCULAR | Status: AC
  Filled 2023-03-31: qty 1

## 2023-03-31 MED ORDER — OXYCODONE HCL 5 MG PO TABS
5.0000 mg | ORAL_TABLET | ORAL | Status: DC | PRN
  Administered 2023-03-31: 10 mg via ORAL
  Administered 2023-03-31: 5 mg via ORAL
  Administered 2023-03-31: 10 mg via ORAL
  Administered 2023-03-31: 5 mg via ORAL
  Administered 2023-04-01 (×3): 10 mg via ORAL

## 2023-03-31 MED ORDER — SODIUM CHLORIDE 0.9 % IV SOLN
2.0000 g | INTRAVENOUS | Status: AC
  Administered 2023-03-31: 2 g via INTRAVENOUS

## 2023-03-31 MED ORDER — ONDANSETRON HCL 4 MG PO TABS: 4.0000 mg | ORAL_TABLET | Freq: Four times a day (QID) | ORAL | Status: DC | PRN

## 2023-03-31 MED ORDER — EPHEDRINE SULFATE (PRESSORS) 50 MG/ML IJ SOLN
INTRAMUSCULAR | Status: DC | PRN
  Administered 2023-03-31: 10 mg via INTRAVENOUS

## 2023-03-31 MED ORDER — BUPROPION HCL ER (XL) 150 MG PO TB24
150.0000 mg | ORAL_TABLET | Freq: Every day | ORAL | Status: DC
  Administered 2023-03-31: 150 mg via ORAL
  Filled 2023-03-31: qty 1

## 2023-03-31 MED ORDER — FENTANYL CITRATE (PF) 100 MCG/2ML IJ SOLN
INTRAMUSCULAR | Status: AC
  Filled 2023-03-31: qty 2

## 2023-03-31 MED ORDER — OXYCODONE HCL 5 MG PO TABS: 5.0000 mg | ORAL_TABLET | Freq: Once | ORAL | Status: DC | PRN

## 2023-03-31 MED ORDER — KETOROLAC TROMETHAMINE 30 MG/ML IJ SOLN: 30.0000 mg | Freq: Once | INTRAMUSCULAR | Status: AC | PRN

## 2023-03-31 MED ORDER — LIDOCAINE HCL (PF) 2 % IJ SOLN
INTRAMUSCULAR | Status: AC
  Filled 2023-03-31: qty 5

## 2023-03-31 MED ORDER — DEXAMETHASONE SODIUM PHOSPHATE 10 MG/ML IJ SOLN
INTRAMUSCULAR | Status: DC | PRN
  Administered 2023-03-31: 5 mg via INTRAVENOUS

## 2023-03-31 MED ORDER — ONDANSETRON HCL 4 MG/2ML IJ SOLN
4.0000 mg | Freq: Four times a day (QID) | INTRAMUSCULAR | Status: DC | PRN
Start: 2023-03-31 — End: 2023-04-01

## 2023-03-31 MED ORDER — GABAPENTIN 100 MG PO CAPS
200.0000 mg | ORAL_CAPSULE | Freq: Two times a day (BID) | ORAL | Status: DC
  Administered 2023-03-31: 200 mg via ORAL

## 2023-03-31 MED ORDER — BUPIVACAINE HCL (PF) 0.25 % IJ SOLN
INTRAMUSCULAR | Status: AC
  Filled 2023-03-31: qty 30

## 2023-03-31 MED ORDER — MIDAZOLAM HCL 2 MG/2ML IJ SOLN
INTRAMUSCULAR | Status: AC
  Filled 2023-03-31: qty 2

## 2023-03-31 MED ORDER — ONDANSETRON HCL 4 MG/2ML IJ SOLN
INTRAMUSCULAR | Status: DC | PRN
  Administered 2023-03-31: 4 mg via INTRAVENOUS

## 2023-03-31 MED ORDER — EZETIMIBE 10 MG PO TABS
10.0000 mg | ORAL_TABLET | Freq: Every day | ORAL | Status: DC
  Administered 2023-03-31: 10 mg via ORAL
  Filled 2023-03-31: qty 1

## 2023-03-31 MED ORDER — STERILE WATER FOR IRRIGATION IR SOLN
Status: DC | PRN
Start: 2023-03-31 — End: 2023-03-31
  Administered 2023-03-31: 500 mL

## 2023-03-31 MED ORDER — OXYCODONE HCL 5 MG PO TABS
ORAL_TABLET | ORAL | Status: AC
  Filled 2023-03-31: qty 2

## 2023-03-31 MED ORDER — ESCITALOPRAM OXALATE 10 MG PO TABS
10.0000 mg | ORAL_TABLET | Freq: Every day | ORAL | Status: DC
  Administered 2023-03-31: 10 mg via ORAL
  Filled 2023-03-31: qty 1

## 2023-03-31 MED ORDER — LACTATED RINGERS IV SOLN: INTRAVENOUS | Status: DC

## 2023-03-31 MED ORDER — ARTIFICIAL TEARS OPHTHALMIC OINT
TOPICAL_OINTMENT | OPHTHALMIC | Status: AC
  Filled 2023-03-31: qty 3.5

## 2023-03-31 MED ORDER — OXYCODONE HCL 5 MG PO TABS
ORAL_TABLET | ORAL | Status: AC
  Filled 2023-03-31: qty 1

## 2023-03-31 MED ORDER — KCL IN DEXTROSE-NACL 20-5-0.2 MEQ/L-%-% IV SOLN
INTRAVENOUS | Status: DC
  Filled 2023-03-31: qty 1000

## 2023-03-31 MED ORDER — PROPOFOL 10 MG/ML IV BOLUS
INTRAVENOUS | Status: DC | PRN
  Administered 2023-03-31: 150 mg via INTRAVENOUS

## 2023-03-31 MED ORDER — MENTHOL 3 MG MT LOZG: 1.0000 | LOZENGE | OROMUCOSAL | Status: DC | PRN

## 2023-03-31 MED ORDER — ONDANSETRON HCL 4 MG/2ML IJ SOLN
INTRAMUSCULAR | Status: AC
  Filled 2023-03-31: qty 2

## 2023-03-31 MED ORDER — SODIUM CHLORIDE 0.9 % IV SOLN
INTRAVENOUS | Status: AC
  Filled 2023-03-31: qty 2

## 2023-03-31 MED ORDER — KETOROLAC TROMETHAMINE 30 MG/ML IJ SOLN
30.0000 mg | Freq: Four times a day (QID) | INTRAMUSCULAR | Status: AC
  Administered 2023-03-31 – 2023-04-01 (×4): 30 mg via INTRAVENOUS

## 2023-03-31 MED ORDER — BUPIVACAINE LIPOSOME 1.3 % IJ SUSP
INTRAMUSCULAR | Status: AC
  Filled 2023-03-31: qty 20

## 2023-03-31 MED ORDER — PROPOFOL 10 MG/ML IV BOLUS
INTRAVENOUS | Status: AC
  Filled 2023-03-31: qty 20

## 2023-03-31 MED ORDER — MEPERIDINE HCL 25 MG/ML IJ SOLN
6.2500 mg | INTRAMUSCULAR | Status: DC | PRN
Start: 2023-03-31 — End: 2023-04-01

## 2023-03-31 MED ORDER — FENTANYL CITRATE (PF) 100 MCG/2ML IJ SOLN: 25.0000 ug | INTRAMUSCULAR | Status: DC | PRN

## 2023-03-31 MED ORDER — POVIDONE-IODINE 10 % EX SWAB: 2.0000 | Freq: Once | CUTANEOUS | Status: DC

## 2023-03-31 MED ORDER — MIDAZOLAM HCL 5 MG/5ML IJ SOLN
INTRAMUSCULAR | Status: DC | PRN
  Administered 2023-03-31: 2 mg via INTRAVENOUS

## 2023-03-31 SURGICAL SUPPLY — 50 items
APL SKNCLS STERI-STRIP NONHPOA (GAUZE/BANDAGES/DRESSINGS) ×1
BARRIER ADHS 3X4 INTERCEED (GAUZE/BANDAGES/DRESSINGS) IMPLANT
BENZOIN TINCTURE PRP APPL 2/3 (GAUZE/BANDAGES/DRESSINGS) ×1 IMPLANT
BLADE EXTENDED COATED 6.5IN (ELECTRODE) IMPLANT
BRR ADH 4X3 ABS CNTRL BYND (GAUZE/BANDAGES/DRESSINGS)
DRAPE WARM FLUID 44X44 (DRAPES) ×1 IMPLANT
DRSG OPSITE POSTOP 4X10 (GAUZE/BANDAGES/DRESSINGS) ×1 IMPLANT
DURAPREP 26ML APPLICATOR (WOUND CARE) ×1 IMPLANT
GAUZE 4X4 16PLY ~~LOC~~+RFID DBL (SPONGE) IMPLANT
GAUZE PAD ABD 8X10 STRL (GAUZE/BANDAGES/DRESSINGS) IMPLANT
GLOVE BIO SURGEON STRL SZ7 (GLOVE) ×1 IMPLANT
GOWN STRL REUS W/TWL LRG LVL3 (GOWN DISPOSABLE) ×1 IMPLANT
HOLDER FOLEY CATH W/STRAP (MISCELLANEOUS) ×1 IMPLANT
KIT TURNOVER CYSTO (KITS) ×1 IMPLANT
LIGASURE IMPACT 36 18CM CVD LR (INSTRUMENTS) IMPLANT
NDL HYPO 22X1.5 SAFETY MO (MISCELLANEOUS) IMPLANT
NDL SPNL 22GX3.5 QUINCKE BK (NEEDLE) ×1 IMPLANT
NEEDLE HYPO 22X1.5 SAFETY MO (MISCELLANEOUS)
NEEDLE SPNL 22GX3.5 QUINCKE BK (NEEDLE) ×1
NS IRRIG 1000ML POUR BTL (IV SOLUTION) IMPLANT
NS IRRIG 500ML POUR BTL (IV SOLUTION) IMPLANT
PACK ABDOMINAL GYN (CUSTOM PROCEDURE TRAY) ×1 IMPLANT
PAD OB MATERNITY 4.3X12.25 (PERSONAL CARE ITEMS) ×1 IMPLANT
RETRACTOR WND ALEXIS 18 MED (MISCELLANEOUS) IMPLANT
RETRACTOR WND ALEXIS 25 LRG (MISCELLANEOUS) IMPLANT
RTRCTR WOUND ALEXIS 18CM MED (MISCELLANEOUS) ×1
RTRCTR WOUND ALEXIS 18CM SML (INSTRUMENTS)
RTRCTR WOUND ALEXIS 25CM LRG (MISCELLANEOUS)
SAVER CELL AAL HAEMONETICS (INSTRUMENTS) IMPLANT
SLEEVE SCD COMPRESS KNEE MED (STOCKING) ×1 IMPLANT
SPIKE FLUID TRANSFER (MISCELLANEOUS) IMPLANT
SPONGE T-LAP 18X18 ~~LOC~~+RFID (SPONGE) ×1 IMPLANT
STRIP CLOSURE SKIN 1/2X4 (GAUZE/BANDAGES/DRESSINGS) ×1 IMPLANT
SUT MNCRL AB 4-0 PS2 18 (SUTURE) ×1 IMPLANT
SUT PDS AB 0 CTX 60 (SUTURE) ×1 IMPLANT
SUT VIC AB 0 CT1 18XCR BRD8 (SUTURE) ×1 IMPLANT
SUT VIC AB 0 CT1 27 (SUTURE)
SUT VIC AB 0 CT1 27XBRD ANBCTR (SUTURE) IMPLANT
SUT VIC AB 0 CT1 8-18 (SUTURE) ×1
SUT VIC AB 2-0 CT1 27 (SUTURE)
SUT VIC AB 2-0 CT1 TAPERPNT 27 (SUTURE) IMPLANT
SUT VIC AB 3-0 CT1 27 (SUTURE)
SUT VIC AB 3-0 CT1 TAPERPNT 27 (SUTURE) IMPLANT
SUT VICRYL 0 TIES 12 18 (SUTURE) ×1 IMPLANT
SYR CONTROL 10ML LL (SYRINGE) IMPLANT
TAPE CLOTH SURG 4X10 WHT LF (GAUZE/BANDAGES/DRESSINGS) IMPLANT
TOWEL OR 17X24 6PK STRL BLUE (TOWEL DISPOSABLE) ×1 IMPLANT
TRAY FOLEY W/BAG SLVR 14FR LF (SET/KITS/TRAYS/PACK) ×1 IMPLANT
WATER STERILE IRR 1000ML POUR (IV SOLUTION) ×2 IMPLANT
WATER STERILE IRR 500ML POUR (IV SOLUTION) IMPLANT

## 2023-03-31 NOTE — Anesthesia Preprocedure Evaluation (Signed)
Anesthesia Evaluation  Patient identified by MRN, date of birth, ID band Patient awake    Reviewed: Allergy & Precautions, H&P , NPO status , Patient's Chart, lab work & pertinent test results  Airway Mallampati: I       Dental no notable dental hx.    Pulmonary neg pulmonary ROS   Pulmonary exam normal        Cardiovascular Normal cardiovascular exam     Neuro/Psych  PSYCHIATRIC DISORDERS Anxiety Depression    negative neurological ROS     GI/Hepatic Neg liver ROS,,,  Endo/Other  negative endocrine ROS    Renal/GU negative Renal ROS  negative genitourinary   Musculoskeletal   Abdominal Normal abdominal exam  (+)   Peds negative pediatric ROS (+)  Hematology negative hematology ROS (+)   Anesthesia Other Findings   Reproductive/Obstetrics negative OB ROS                             Anesthesia Physical Anesthesia Plan  ASA: 2  Anesthesia Plan: General   Post-op Pain Management:    Induction: Intravenous  PONV Risk Score and Plan: 4 or greater and Ondansetron, Dexamethasone and Midazolam  Airway Management Planned: Oral ETT  Additional Equipment: None  Intra-op Plan:   Post-operative Plan: Extubation in OR  Informed Consent: I have reviewed the patients History and Physical, chart, labs and discussed the procedure including the risks, benefits and alternatives for the proposed anesthesia with the patient or authorized representative who has indicated his/her understanding and acceptance.     Dental advisory given  Plan Discussed with: CRNA  Anesthesia Plan Comments:        Anesthesia Quick Evaluation

## 2023-03-31 NOTE — Progress Notes (Signed)
The patient was re-examined with no change in status 

## 2023-03-31 NOTE — Anesthesia Postprocedure Evaluation (Signed)
Anesthesia Post Note  Patient: Yvette Ferguson  Procedure(s) Performed: OPEN SALPINGO OOPHORECTOMY (Bilateral: Abdomen)     Patient location during evaluation: PACU Anesthesia Type: General Level of consciousness: awake Pain management: pain level controlled Vital Signs Assessment: post-procedure vital signs reviewed and stable Respiratory status: spontaneous breathing Cardiovascular status: stable Postop Assessment: no apparent nausea or vomiting Anesthetic complications: no  No notable events documented.  Last Vitals:  Vitals:   03/31/23 0945 03/31/23 0957  BP: 136/78 139/81  Pulse: 68 63  Resp: 16   Temp:  36.7 C  SpO2: 97% 100%    Last Pain:  Vitals:   03/31/23 1000  TempSrc:   PainSc: 1                  [employer]

## 2023-03-31 NOTE — Transfer of Care (Signed)
Immediate Anesthesia Transfer of Care Note  Patient: Yvette Ferguson  Procedure(s) Performed: OPEN SALPINGO OOPHORECTOMY (Bilateral: Abdomen)  Patient Location: PACU  Anesthesia Type:General  Level of Consciousness: awake, alert , oriented, and patient cooperative  Airway & Oxygen Therapy: Patient Spontanous Breathing and Patient connected to nasal cannula oxygen  Post-op Assessment: Report given to RN and Post -op Vital signs reviewed and stable  Post vital signs: Reviewed and stable  Last Vitals:  Vitals Value Taken Time  BP 145/83 03/31/23 0900  Temp    Pulse 67 03/31/23 0905  Resp 16 03/31/23 0905  SpO2 100 % 03/31/23 0905  Vitals shown include unfiled device data.  Last Pain:  Vitals:   03/31/23 0551  TempSrc: Oral  PainSc: 5       Patients Stated Pain Goal: 3 (03/31/23 0551)  Complications: No notable events documented.

## 2023-03-31 NOTE — Op Note (Signed)
Preoperative diagnosis: Bilateral ovarian cyst  Postoperative diagnosis: Same  Procedure: Laparotomy with BSO  Surgeon: Marcelle Overlie  Assistant: Chand  EBL: 125 cc  Procedure findings:  The patient take to the operating room after an adequate level of general anesthesia was obtained with the patient supine and the abdomen prepped and draped and a Foley catheter was positioned.  This was done after appropriate timeout was taken.  Pfannenstiel incision made through the old scar which was well-healed, this was carried down to the fascia she was very thin, fascia was dissected transversely rectus muscle divided in the midline, peritoneum entered superiorly without incident and extended vertically.  A medium Alexis retractor was positioned and bowels packed superiorly out the field and she was placed in Trendelenburg.  By inspection and palpation could tell that she did have 6 cm irregular right ovarian cyst there were no excrescences the surface was smooth there was no free fluid smaller cyst approximately 3 cm on the left as per the ultrasound and preop MRI findings.  Starting on the right the right round ligament was grasped divided, retroperitoneal space on that side was divided until the right IP ligament could be isolated, the course of the ureter was well below, the right IP ligament was isolated clamped close to the ovary, first free tie followed by suture ligature 0 Vicryl.  Remaining small a vascular pedicle was clamped cut and free tied.  Inspection of the lateral wall on that side revealed hemostasis at the operative site.  On the left there was some periadnexal adhesions that were lysed just with surgical blunt dissection until the ovary could be elevated, similarly the course of the ureter was noted to be well below, the left IP ligament was clamped close to the ovary divided first free tie followed by suture ligature 0 Vicryl.  Pelvis is irrigated with saline and aspirated bilateral operative  sites inspected noted to be hemostatic the appendix was noted and was unremarkable no other significant findings noted.  Prior to closure sponge, needle, instrument counts reported as correct x 2.  Peritoneum closed with running 2-0 Vicryl suture.  Double looped 0 PDS used to close the fascia diluted Exparel was then instilled subfascially and subcutaneously for postoperative analgesia.  The subcu area was very thin and was not close separately this was hemostatic after irrigation.  4-0 Monocryl subcuticular closure with Steri-Strips benzoin and a pressure dressing.  Clear urine noted at the end the case she tolerated this well went to recovery room in good condition.  Dictated with [employer]  Meriel Pica MD

## 2023-03-31 NOTE — Anesthesia Procedure Notes (Signed)
Procedure Name: Intubation Date/Time: 03/31/2023 7:38 AM  Performed by: Bishop Limbo, CRNAPre-anesthesia Checklist: Patient identified, Emergency Drugs available, Suction available and Patient being monitored Patient Re-evaluated:Patient Re-evaluated prior to induction Oxygen Delivery Method: Circle System Utilized Preoxygenation: Pre-oxygenation with 100% oxygen Induction Type: IV induction Ventilation: Mask ventilation without difficulty Laryngoscope Size: Mac and 3 Grade View: Grade I Tube type: Oral Tube size: 7.0 mm Number of attempts: 1 Airway Equipment and Method: Stylet Placement Confirmation: ETT inserted through vocal cords under direct vision, positive ETCO2 and breath sounds checked- equal and bilateral Secured at: 22 cm Tube secured with: Tape Dental Injury: Teeth and Oropharynx as per pre-operative assessment

## 2023-04-01 DIAGNOSIS — Z515 Encounter for palliative care: Secondary | ICD-10-CM

## 2023-04-01 DIAGNOSIS — N83202 Unspecified ovarian cyst, left side: Secondary | ICD-10-CM | POA: Diagnosis not present

## 2023-04-01 LAB — CBC
HCT: 34.4 % — ABNORMAL LOW (ref 36.0–46.0)
Hemoglobin: 11.5 g/dL — ABNORMAL LOW (ref 12.0–15.0)
MCH: 34.4 pg — ABNORMAL HIGH (ref 26.0–34.0)
MCHC: 33.4 g/dL (ref 30.0–36.0)
MCV: 103 fL — ABNORMAL HIGH (ref 80.0–100.0)
Platelets: 190 10*3/uL (ref 150–400)
RBC: 3.34 MIL/uL — ABNORMAL LOW (ref 3.87–5.11)
RDW: 12.8 % (ref 11.5–15.5)
WBC: 9.9 10*3/uL (ref 4.0–10.5)
nRBC: 0 % (ref 0.0–0.2)

## 2023-04-01 LAB — SURGICAL PATHOLOGY

## 2023-04-01 MED ORDER — OXYCODONE HCL 5 MG PO TABS: 5.0000 mg | ORAL_TABLET | ORAL | 0 refills | Status: AC | PRN

## 2023-04-01 MED ORDER — KETOROLAC TROMETHAMINE 30 MG/ML IJ SOLN
INTRAMUSCULAR | Status: AC
  Filled 2023-04-01: qty 1

## 2023-04-01 MED ORDER — OXYCODONE HCL 5 MG PO TABS
ORAL_TABLET | ORAL | Status: AC
  Filled 2023-04-01: qty 2

## 2023-04-01 NOTE — Discharge Summary (Signed)
Physician Discharge Summary  Patient ID: Yvette Ferguson MRN: [id number] DOB/AGE: [id number] 66 y.o.  Admit date: 03/31/2023 Discharge date: 04/01/2023  Admission Diagnoses:  Discharge Diagnoses:  Principal Problem:   Bilateral ovarian cysts Active Problems:   Hospice care patient   Palliative care patient   Discharged Condition: good  Hospital Course: adm for EL >>BSO  for bilat ovarian cysts, on POD 1, afeb, tol PO and ready for D/C  Consults: None  Significant Diagnostic Studies: labs:  Results for orders placed or performed during the hospital encounter of 03/31/23 (from the past 48 hour(s))  CBC     Status: Abnormal   Collection Time: 03/31/23  6:10 AM  Result Value Ref Range   WBC 6.1 4.0 - 10.5 K/uL   RBC 3.80 (L) 3.87 - 5.11 MIL/uL   Hemoglobin 13.0 12.0 - 15.0 g/dL   HCT 40.9 81.1 - 91.4 %   MCV 100.8 (H) 80.0 - 100.0 fL   MCH 34.2 (H) 26.0 - 34.0 pg   MCHC 33.9 30.0 - 36.0 g/dL   RDW 78.2 95.6 - 21.3 %   Platelets 214 150 - 400 K/uL   nRBC 0.0 0.0 - 0.2 %    Comment: Performed at [facility], [address] [place], [place] [address]  Type and screen [facility]     Status: None   Collection Time: 03/31/23  6:10 AM  Result Value Ref Range   ABO/RH(D) A POS    Antibody Screen NEG    Sample Expiration      04/03/2023,2359 Performed at [facility], [address], [place], [place] [address]   CBC     Status: Abnormal   Collection Time: 04/01/23 12:14 AM  Result Value Ref Range   WBC 9.9 4.0 - 10.5 K/uL   RBC 3.34 (L) 3.87 - 5.11 MIL/uL   Hemoglobin 11.5 (L) 12.0 - 15.0 g/dL   HCT 29.5 (L) 28.4 - 13.2 %   MCV 103.0 (H) 80.0 - 100.0 fL   MCH 34.4 (H) 26.0 - 34.0 pg   MCHC 33.4 30.0 - 36.0 g/dL   RDW 44.0 10.2 - 72.5 %   Platelets 190 150 - 400 K/uL   nRBC 0.0 0.0 - 0.2 %    Comment: Performed at [facility], [address] [place], [place] [address]     Treatments:  surgery: EL>>BSO  Discharge Exam: Blood pressure 108/63, pulse 66, temperature 98 F (36.7 C), resp. rate 16, height 5\' 4"  (1.626 m), weight 58.1 kg, SpO2 95%. Dressing dry, abd soft + BS  Disposition: Discharge disposition: 01-Home or Self Care        Allergies as of 04/01/2023   No Known Allergies      Medication List     TAKE these medications    ALPRAZolam 0.5 MG tablet Commonly known as: XANAX Take 0.5 mg by mouth at bedtime as needed for anxiety.   ascorbic acid 500 MG tablet Commonly known as: VITAMIN C Take 500 mg by mouth daily.   buPROPion 150 MG 24 hr tablet Commonly known as: WELLBUTRIN XL TAKE 1 TABLET (150 MG TOTAL) BY MOUTH DAILY. KEEP AUGUST APPT FOR FUTURE REFILLS What changed: when to take this   escitalopram 10 MG tablet Commonly known as: LEXAPRO Take 10 mg by mouth at bedtime.   ezetimibe 10 MG tablet Commonly known as: ZETIA Take 1 tablet (10 mg total) by mouth at bedtime.   ibuprofen 800  MG tablet Commonly known as: ADVIL Take 800 mg by mouth every 8 (eight) hours as needed.   multivitamin tablet Take 1 tablet by mouth daily.   oxyCODONE 5 MG immediate release tablet Commonly known as: Oxy IR/ROXICODONE Take 1-2 tablets (5-10 mg total) by mouth every 4 (four) hours as needed for moderate pain (pain score 4-6).   valACYclovir 1000 MG tablet Commonly known as: VALTREX TAKE 1 TABLET BY MOUTH EVERY DAY What changed:  how much to take how to take this when to take this reasons to take this        Follow-up Information     Richarda Overlie, MD. Schedule an appointment as soon as possible for a visit in 1 week(s).   Specialty: Obstetrics and Gynecology Contact information: [address] [place] [address] [phone]                 Signed: Meriel Pica 04/01/2023, 7:42 AM

## 2023-04-02 ENCOUNTER — Encounter (HOSPITAL_BASED_OUTPATIENT_CLINIC_OR_DEPARTMENT_OTHER): Payer: Self-pay | Admitting: Obstetrics and Gynecology

## 2023-04-21 DIAGNOSIS — Z1231 Encounter for screening mammogram for malignant neoplasm of breast: Secondary | ICD-10-CM | POA: Diagnosis not present

## 2023-04-21 DIAGNOSIS — Z4889 Encounter for other specified surgical aftercare: Secondary | ICD-10-CM | POA: Diagnosis not present

## 2023-04-25 ENCOUNTER — Other Ambulatory Visit: Payer: Self-pay | Admitting: Nurse Practitioner

## 2023-06-18 DIAGNOSIS — H2512 Age-related nuclear cataract, left eye: Secondary | ICD-10-CM | POA: Diagnosis not present

## 2023-06-25 ENCOUNTER — Ambulatory Visit: Payer: PPO

## 2023-06-25 VITALS — BP 130/80 | HR 67 | Ht 64.5 in | Wt 129.4 lb

## 2023-06-25 DIAGNOSIS — Z780 Asymptomatic menopausal state: Secondary | ICD-10-CM | POA: Diagnosis not present

## 2023-06-25 DIAGNOSIS — Z0000 Encounter for general adult medical examination without abnormal findings: Secondary | ICD-10-CM | POA: Diagnosis not present

## 2023-06-25 NOTE — Patient Instructions (Signed)
 Yvette Ferguson , Thank you for taking time to come for your Medicare Wellness Visit. I appreciate your ongoing commitment to your health goals. Please review the following plan we discussed and let me know if I can assist you in the future.   Referrals/Orders/Follow-Ups/Clinician Recommendations: It was nice to meet you today.  Aim for 30 minutes of exercise or brisk walking, 6-8 glasses of water , and 5 servings of fruits and vegetables each day.  You have an order for:  [x]   Bone Density     Please call for appointment:  [facility] at [facility] [address] [place], [place] [address] [phone]  Make sure to wear two-piece clothing.  No lotions, powders, or deodorants the day of the appointment. Make sure to bring picture ID and insurance card.  Bring list of medications you are currently taking including any supplements.   Schedule your [place] screening mammogram through MyChart!   Log into your MyChart account.  Go to 'Visit' (or 'Appointments' if on mobile App) --> Schedule an Appointment  Under 'Select a Reason for Visit' choose the Mammogram Screening option.  Complete the pre-visit questions and select the time and place that best fits your schedule.    This is a list of the screening recommended for you and due dates:  Health Maintenance  Topic Date Due   Mammogram  03/18/2024   DTaP/Tdap/Td vaccine (2 - Td or Tdap) 02/26/2027   Colon Cancer Screening  02/08/2031   DEXA scan (bone density measurement)  Completed   Hepatitis C Screening  Completed   HPV Vaccine  Aged Out   Pneumonia Vaccine  Discontinued   Flu Shot  Discontinued   COVID-19 Vaccine  Discontinued   Zoster (Shingles) Vaccine  Discontinued    Advanced directives: (In Chart) A copy of your advanced directives are scanned into your chart should your provider ever need it.  Next Medicare Annual Wellness Visit scheduled for next year: Yes

## 2023-06-25 NOTE — Progress Notes (Signed)
 Subjective:   Yvette Ferguson is a 68 y.o. female who presents for an Initial Medicare Annual Wellness Visit.  Visit Complete: In person   Cardiac Risk Factors include: advanced age (>86men, >41 women);hypertension;dyslipidemia;Other (see comment), Risk factor comments: Osteopenia     Objective:    Today's Vitals   06/25/23 0852  BP: 130/80  Pulse: 67  SpO2: 98%  Weight: 129 lb 6.4 oz (58.7 kg)  Height: 5' 4.5" (1.638 m)   Body mass index is 21.87 kg/m.     06/25/2023    8:22 AM 03/31/2023    5:48 AM 09/12/2022   10:31 AM 05/09/2020    4:09 PM 05/09/2020    6:47 AM 04/27/2020    9:18 AM  Advanced Directives  Does Patient Have a Medical Advance Directive? Yes Yes Yes Yes Yes Yes  Type of Estate agent of [place];Living will  Healthcare Power of [employer] Power of [employer] Power of [place];Living will Healthcare Power of [place];Living will  Does patient want to make changes to medical advance directive? No - Patient declined No - Patient declined  No - Patient declined    Copy of Healthcare Power of Attorney in Chart? No - copy requested  No - copy requested   No - copy requested    Current Medications (verified) Outpatient Encounter Medications as of 06/25/2023  Medication Sig   ALPRAZolam  (XANAX ) 0.5 MG tablet Take 0.5 mg by mouth at bedtime as needed for anxiety.   ascorbic acid (VITAMIN C) 500 MG tablet Take 500 mg by mouth daily.   buPROPion  (WELLBUTRIN  XL) 150 MG 24 hr tablet Take 1 tablet (150 mg total) by mouth daily. Keep August appt for future refills   escitalopram  (LEXAPRO ) 10 MG tablet Take 10 mg by mouth at bedtime.   ezetimibe  (ZETIA ) 10 MG tablet Take 1 tablet (10 mg total) by mouth at bedtime.   ibuprofen  (ADVIL ) 800 MG tablet Take 800 mg by mouth every 8 (eight) hours as needed.   Multiple Vitamin (MULTIVITAMIN) tablet Take 1 tablet by mouth daily.   valACYclovir  (VALTREX ) 1000 MG tablet TAKE 1 TABLET  BY MOUTH EVERY DAY (Patient taking differently: Take 1,000 mg by mouth as needed. TAKE 1 TABLET BY MOUTH EVERY DAY)   oxyCODONE  (OXY IR/ROXICODONE ) 5 MG immediate release tablet Take 1-2 tablets (5-10 mg total) by mouth every 4 (four) hours as needed for moderate pain (pain score 4-6). (Patient not taking: Reported on 06/25/2023)   No facility-administered encounter medications on file as of 06/25/2023.    Allergies (verified) Patient has no known allergies.   History: Past Medical History:  Diagnosis Date   Anxiety    Depression    Essential tremor    Herpes labialis    chronic, intermittant   History of gastroesophageal reflux (GERD)    Hyperlipidemia    OA (osteoarthritis)    Ovarian cyst, bilateral    Wears glasses    Past Surgical History:  Procedure Laterality Date   BLEPHAROPLASTY Bilateral 2007   upper eyelid   BREAST ENHANCEMENT SURGERY Bilateral 1999   w/ implants   COLONOSCOPY  2011   LAPAROSCOPIC ASSISTED VAGINAL HYSTERECTOMY  2001   SALPINGOOPHORECTOMY Bilateral 03/31/2023   Procedure: OPEN SALPINGO OOPHORECTOMY;  Surgeon: Woodrow Hazy, MD;  Location[facility];  Service: Gynecology;  Laterality: Bilateral;   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   Procedure: TOTAL HIP ARTHROPLASTY ANTERIOR APPROACH;  Surgeon: Claiborne Crew, MD;  Location: [facility];  Service: Orthopedics;  Laterality: Left;  70 mins   Family History  Problem Relation Age of Onset   Diabetes Mother    Pancreatic cancer Mother    Hypertension Mother    Diabetes Father    Leukemia Father    Hypertension Father    Social History   Socioeconomic History   Marital status: Single    Spouse name: Not on file   Number of children: 2   Years of education: Not on file   Highest education level: Not on file  Occupational History   Occupation: Self employed/ Simply organized and clean  Tobacco Use   Smoking status: Never   Smokeless tobacco: Never  Vaping Use   Vaping  status: Never Used  Substance and Sexual Activity   Alcohol use: Yes    Comment: 03-20-2023  per pt one bottle wine per week   Drug use: Never   Sexual activity: Yes    Birth control/protection: Surgical    Comment: Hysterectomy  Other Topics Concern   Not on file  Social History Narrative   Lives alone with 1 dog   Social Drivers of Health   Financial Resource Strain: Low Risk  (06/25/2023)   Overall Financial Resource Strain (CARDIA)    Difficulty of Paying Living Expenses: Not very hard  Food Insecurity: No Food Insecurity (06/25/2023)   Hunger Vital Sign    Worried About Running Out of Food in the Last Year: Never true    Ran Out of Food in the Last Year: Never true  Transportation Needs: No Transportation Needs (06/25/2023)   PRAPARE - Administrator, Civil Service (Medical): No    Lack of Transportation (Non-Medical): No  Physical Activity: Sufficiently Active (06/25/2023)   Exercise Vital Sign    Days of Exercise per Week: 5 days    Minutes of Exercise per Session: 30 min  Stress: No Stress Concern Present (06/25/2023)   [employer] of Occupational Health - Occupational Stress Questionnaire    Feeling of Stress : Not at all  Social Connections: Socially Isolated (06/25/2023)   Social Connection and Isolation Panel [NHANES]    Frequency of Communication with Friends and Family: More than three times a week    Frequency of Social Gatherings with Friends and Family: More than three times a week    Attends Religious Services: Never    Database administrator or Organizations: No    Attends Engineer, structural: Never    Marital Status: Divorced    Tobacco Counseling Counseling given: Not Answered   Clinical Intake:  Pre-visit preparation completed: Yes  Pain : No/denies pain     Nutritional Risks: None Diabetes: No  How often do you need to have someone help you when you read instructions, pamphlets, or other written materials from your  doctor or pharmacy?: 1 - Never  Interpreter Needed?: No  Information entered by :: Pheng Prokop, RMA   Activities of Daily Living    06/25/2023    8:08 AM 03/31/2023    5:55 AM  In your present state of health, do you have any difficulty performing the following activities:  Hearing? 1 0  Comment some hearing loss in lt ear-per pt   Vision? 0 0  Difficulty concentrating or making decisions? 0 0  Walking or climbing stairs? 0   Dressing or bathing? 0   Doing errands, shopping? 0   Preparing Food and eating ? N   Using the Toilet? N  In the past six months, have you accidently leaked urine? N   Do you have problems with loss of bowel control? N   Managing your Medications? N   Managing your Finances? N   Housekeeping or managing your Housekeeping? N     Patient Care Team: Zorita Hiss, NP as PCP - General (Nurse Practitioner) Knox Perl, MD as Consulting Physician (Cardiology) [facility], [place]  Indicate any recent Medical Services you may have received from other than [facility] providers in the past year (date may be approximate).     Assessment:   This is a routine wellness examination for [facility].  Hearing/Vision screen Hearing Screening - Comments:: Some loss in lt ear-per pt Vision Screening - Comments:: Wears eyeglasses   Goals Addressed               This Visit's Progress     Patient Stated (pt-stated)        Would like to get into strength training      Depression Screen    06/25/2023    8:29 AM 08/08/2022   10:16 AM 05/09/2022   10:18 AM 02/07/2022    9:52 AM 11/22/2021    9:05 AM 10/19/2021    8:08 AM 04/02/2017    1:31 PM  PHQ 2/9 Scores  PHQ - 2 Score 0 0 0 0 0 0 0  PHQ- 9 Score 0 2 0 2       Fall Risk    06/25/2023    8:23 AM 08/08/2022   10:16 AM 02/07/2022    9:58 AM 02/07/2022    9:52 AM 11/22/2021    9:05 AM  Fall Risk   Falls in the past year? 0 0 0 0 0  Number falls in past yr: 0 0 0 0 0  Injury with Fall? 0 0 0 0 0  Risk  for fall due to : No Fall Risks No Fall Risks No Fall Risks No Fall Risks   Follow up Falls prevention discussed;Falls evaluation completed Falls evaluation completed Falls evaluation completed Falls evaluation completed     MEDICARE RISK AT HOME: Medicare Risk at Home Any stairs in or around the home?: Yes (outside) If so, are there any without handrails?: Yes Home free of loose throw rugs in walkways, pet beds, electrical cords, etc?: Yes Adequate lighting in your home to reduce risk of falls?: Yes Life alert?: No Use of a cane, walker or w/c?: No Grab bars in the bathroom?: No Shower chair or bench in shower?: Yes (stool) Elevated toilet seat or a handicapped toilet?: No  TIMED UP AND GO:  Was the test performed? Yes  Length of time to ambulate 10 feet: 10 sec Gait steady and fast without use of assistive device    Cognitive Function:        06/25/2023    8:10 AM  6CIT Screen  What Year? 0 points  What month? 0 points  What time? 0 points  Count back from 20 0 points  Months in reverse 0 points  Repeat phrase 0 points  Total Score 0 points    Immunizations Immunization History  Administered Date(s) Administered   Influenza,inj,Quad PF,6+ Mos 02/25/2017   Influenza,inj,quad, With Preservative 04/28/2018, 03/29/2020   PPD Test 07/29/2018   Tdap 02/25/2017    TDAP status: Up to date  Flu Vaccine status: Declined, Education has been provided regarding the importance of this vaccine but patient still declined. Advised may receive this vaccine at local  pharmacy or Health Dept. Aware to provide a copy of the vaccination record if obtained from local pharmacy or Health Dept. Verbalized acceptance and understanding.  Pneumococcal vaccine status: Declined,  Education has been provided regarding the importance of this vaccine but patient still declined. Advised may receive this vaccine at local pharmacy or Health Dept. Aware to provide a copy of the vaccination record if  obtained from local pharmacy or Health Dept. Verbalized acceptance and understanding.   Covid-19 vaccine status: Declined, Education has been provided regarding the importance of this vaccine but patient still declined. Advised may receive this vaccine at local pharmacy or Health Dept.or vaccine clinic. Aware to provide a copy of the vaccination record if obtained from local pharmacy or Health Dept. Verbalized acceptance and understanding.  Qualifies for Shingles Vaccine? Yes   Zostavax completed  Declines   Shingrix Completed?: No.    Education has been provided regarding the importance of this vaccine. Patient has been advised to call insurance company to determine out of pocket expense if they have not yet received this vaccine. Advised may also receive vaccine at local pharmacy or Health Dept. Verbalized acceptance and understanding.  Screening Tests Health Maintenance  Topic Date Due   MAMMOGRAM  03/18/2024   DTaP/Tdap/Td (2 - Td or Tdap) 02/26/2027   Colonoscopy  02/08/2031   DEXA SCAN  Completed   Hepatitis C Screening  Completed   HPV VACCINES  Aged Out   Pneumonia Vaccine 77+ Years old  Discontinued   INFLUENZA VACCINE  Discontinued   COVID-19 Vaccine  Discontinued   Zoster Vaccines- Shingrix  Discontinued    Health Maintenance  There are no preventive care reminders to display for this patient.   Colorectal cancer screening: Type of screening: Cologuard. Completed 02/07/2021. Repeat every 3 years  Mammogram status: Completed 2024-per pt. Repeat every year [facility])  Bone Density status: Ordered 06/25/2023. Pt provided with contact info and advised to call to schedule appt.  Lung Cancer Screening: (Low Dose CT Chest recommended if Age 86-80 years, 20 pack-year currently smoking OR have quit w/in 15years.) does not qualify.   Lung Cancer Screening Referral: N/A  Additional Screening:  Hepatitis C Screening: does qualify; Completed 02/25/2017  Vision  Screening: Recommended annual ophthalmology exams for early detection of glaucoma and other disorders of the eye. Is the patient up to date with their annual eye exam?  Yes  Who is the provider or what is the name of the office in which the patient attends annual eye exams? Dr. Brooks Cao ([facility]) If pt is not established with a provider, would they like to be referred to a provider to establish care? No .   Dental Screening: Recommended annual dental exams for proper oral hygiene  Community Resource Referral / Chronic Care Management: CRR required this visit?  No   CCM required this visit?  No     Plan:     I have personally reviewed and noted the following in the patient's chart:   Medical and social history Use of alcohol, tobacco or illicit drugs  Current medications and supplements including opioid prescriptions. Patient is not currently taking opioid prescriptions. Functional ability and status Nutritional status Physical activity Advanced directives List of other physicians Hospitalizations, surgeries, and ER visits in previous 12 months Vitals Screenings to include cognitive, depression, and falls Referrals and appointments  In addition, I have reviewed and discussed with patient certain preventive protocols, quality metrics, and best practice recommendations. A written personalized care  plan for preventive services as well as general preventive health recommendations were provided to patient.     Jonice Cerra L Peng Thorstenson, CMA   06/25/2023   After Visit Summary: (MyChart) Due to this being a telephonic visit, the after visit summary with patients personalized plan was offered to patient via MyChart   Nurse Notes: Patient declines vaccines at this time.  She is due for a DEXA and order has been placed.  Patient had no other concerns to address today.

## 2023-09-03 ENCOUNTER — Encounter (HOSPITAL_BASED_OUTPATIENT_CLINIC_OR_DEPARTMENT_OTHER): Payer: Self-pay

## 2023-09-03 ENCOUNTER — Emergency Department (HOSPITAL_BASED_OUTPATIENT_CLINIC_OR_DEPARTMENT_OTHER)
Admission: EM | Admit: 2023-09-03 | Discharge: 2023-09-03 | Disposition: A | Discharge status: Home / Self Care | Attending: Emergency Medicine | Admitting: Emergency Medicine

## 2023-09-03 ENCOUNTER — Emergency Department (HOSPITAL_BASED_OUTPATIENT_CLINIC_OR_DEPARTMENT_OTHER): Admitting: Radiology

## 2023-09-03 ENCOUNTER — Other Ambulatory Visit: Payer: Self-pay

## 2023-09-03 ENCOUNTER — Other Ambulatory Visit (HOSPITAL_BASED_OUTPATIENT_CLINIC_OR_DEPARTMENT_OTHER): Payer: Self-pay

## 2023-09-03 DIAGNOSIS — K219 Gastro-esophageal reflux disease without esophagitis: Secondary | ICD-10-CM | POA: Diagnosis not present

## 2023-09-03 DIAGNOSIS — Z79899 Other long term (current) drug therapy: Secondary | ICD-10-CM | POA: Diagnosis not present

## 2023-09-03 DIAGNOSIS — R1110 Vomiting, unspecified: Secondary | ICD-10-CM | POA: Diagnosis present

## 2023-09-03 DIAGNOSIS — Z87898 Personal history of other specified conditions: Secondary | ICD-10-CM | POA: Diagnosis not present

## 2023-09-03 DIAGNOSIS — R1013 Epigastric pain: Secondary | ICD-10-CM | POA: Diagnosis not present

## 2023-09-03 DIAGNOSIS — R079 Chest pain, unspecified: Secondary | ICD-10-CM | POA: Diagnosis not present

## 2023-09-03 DIAGNOSIS — R0789 Other chest pain: Secondary | ICD-10-CM | POA: Diagnosis not present

## 2023-09-03 LAB — BASIC METABOLIC PANEL
Anion gap: 8 (ref 5–15)
BUN: 19 mg/dL (ref 8–23)
CO2: 29 mmol/L (ref 22–32)
Calcium: 9.8 mg/dL (ref 8.9–10.3)
Chloride: 107 mmol/L (ref 98–111)
Creatinine, Ser: 0.72 mg/dL (ref 0.44–1.00)
GFR, Estimated: >60 mL/min (ref >60)
Glucose, Bld: 162 mg/dL — ABNORMAL HIGH (ref 70–99)
Potassium: 3.8 mmol/L (ref 3.5–5.1)
Sodium: 144 mmol/L (ref 135–145)

## 2023-09-03 LAB — HEPATIC FUNCTION PANEL
ALT: 24 U/L (ref 0–44)
AST: 23 U/L (ref 15–41)
Albumin: 4.7 g/dL (ref 3.5–5.0)
Alkaline Phosphatase: 41 U/L (ref 38–126)
Bilirubin, Direct: 0.1 mg/dL (ref 0.0–0.2)
Indirect Bilirubin: 0.4 mg/dL (ref 0.3–0.9)
Total Bilirubin: 0.5 mg/dL (ref 0.0–1.2)
Total Protein: 6.8 g/dL (ref 6.5–8.1)

## 2023-09-03 LAB — CBC
HCT: 39.3 % (ref 36.0–46.0)
Hemoglobin: 13.7 g/dL (ref 12.0–15.0)
MCH: 33.8 pg (ref 26.0–34.0)
MCHC: 34.9 g/dL (ref 30.0–36.0)
MCV: 97 fL (ref 80.0–100.0)
Platelets: 261 10*3/uL (ref 150–400)
RBC: 4.05 MIL/uL (ref 3.87–5.11)
RDW: 13.2 % (ref 11.5–15.5)
WBC: 8.8 10*3/uL (ref 4.0–10.5)
nRBC: 0 % (ref 0.0–0.2)

## 2023-09-03 LAB — TROPONIN I (HIGH SENSITIVITY): Troponin I (High Sensitivity): <2 ng/L (ref <18)

## 2023-09-03 LAB — LIPASE, BLOOD: Lipase: 27 U/L (ref 11–51)

## 2023-09-03 MED ORDER — FAMOTIDINE 20 MG PO TABS
20.0000 mg | ORAL_TABLET | Freq: Once | ORAL | Status: AC
  Administered 2023-09-03: 20 mg via ORAL
  Filled 2023-09-03: qty 1

## 2023-09-03 MED ORDER — ALUM & MAG HYDROXIDE-SIMETH 200-200-20 MG/5ML PO SUSP
30.0000 mL | Freq: Once | ORAL | Status: AC
  Administered 2023-09-03: 30 mL via ORAL
  Filled 2023-09-03: qty 30

## 2023-09-03 MED ORDER — OMEPRAZOLE 20 MG PO CPDR
20.0000 mg | DELAYED_RELEASE_CAPSULE | Freq: Every day | ORAL | 2 refills | Status: AC
  Filled 2023-09-03: qty 30, 30d supply, fill #0

## 2023-09-03 MED ORDER — ONDANSETRON 4 MG PO TBDP
4.0000 mg | ORAL_TABLET | Freq: Once | ORAL | Status: DC
  Filled 2023-09-03: qty 1

## 2023-09-03 MED ORDER — SODIUM CHLORIDE 0.9 % IV BOLUS
1000.0000 mL | Freq: Once | INTRAVENOUS | Status: AC
Start: 2023-09-03 — End: 2023-09-03
  Administered 2023-09-03: 1000 mL via INTRAVENOUS

## 2023-09-03 MED ORDER — ONDANSETRON HCL 4 MG/2ML IJ SOLN
4.0000 mg | Freq: Once | INTRAMUSCULAR | Status: AC
  Administered 2023-09-03: 4 mg via INTRAVENOUS
  Filled 2023-09-03: qty 2

## 2023-09-03 NOTE — ED Notes (Signed)

## 2023-09-03 NOTE — ED Provider Notes (Signed)
 Yvette Ferguson EMERGENCY DEPARTMENT AT [facility] Provider Note   CSN: [id number] Arrival date & time: 09/03/23  1036     History  Chief Complaint  Patient presents with   Chest Pain   Emesis    Yvette Ferguson is a 68 y.o. female.  Patient is a 68 year old female with past medical history of hyperlipidemia, GERD, anxiety, depression presenting for complaints of chest discomfort that started after an episode of nausea and vomiting occurred overnight/early this morning.  Patient states the vomiting has stopped at this time.  She admits to epigastric abdominal pain that radiates up into the chest, 7 out of 10 pain severity.  She also has concerns for some numbness and tingling in her right lower extremity.  She denies any fevers or chills.  Denies any diarrhea or constipation.  Denies any black or bloody stools.  Denies any sick contacts.   Chest Pain Associated symptoms: vomiting   Associated symptoms: no abdominal pain, no back pain, no cough, no fever, no palpitations and no shortness of breath   Emesis Associated symptoms: no abdominal pain, no arthralgias, no chills, no cough, no fever and no sore throat        Home Medications Prior to Admission medications   Medication Sig Start Date End Date Taking? Authorizing Provider  omeprazole (PRILOSEC) 20 MG capsule Take 1 capsule (20 mg total) by mouth daily. 09/03/23  Yes Edwin Dada P, DO  ALPRAZolam Prudy Feeler) 0.5 MG tablet Take 0.5 mg by mouth at bedtime as needed for anxiety.    [provider]  ascorbic acid (VITAMIN C) 500 MG tablet Take 500 mg by mouth daily.    [provider]  buPROPion (WELLBUTRIN XL) 150 MG 24 hr tablet Take 1 tablet (150 mg total) by mouth daily. Keep August appt for future refills 04/25/23   Elenore Paddy, NP  escitalopram (LEXAPRO) 10 MG tablet Take 10 mg by mouth at bedtime.    [provider]  ezetimibe (ZETIA) 10 MG tablet Take 1 tablet (10 mg total) by mouth  at bedtime. 03/25/23   Worthy Rancher B, FNP  ibuprofen (ADVIL) 800 MG tablet Take 800 mg by mouth every 8 (eight) hours as needed.    [provider]  Multiple Vitamin (MULTIVITAMIN) tablet Take 1 tablet by mouth daily.    [provider]  oxyCODONE (OXY IR/ROXICODONE) 5 MG immediate release tablet Take 1-2 tablets (5-10 mg total) by mouth every 4 (four) hours as needed for moderate pain (pain score 4-6). Patient not taking: Reported on 06/25/2023 04/01/23   Richarda Overlie, MD  valACYclovir (VALTREX) 1000 MG tablet TAKE 1 TABLET BY MOUTH EVERY DAY Patient taking differently: Take 1,000 mg by mouth as needed. TAKE 1 TABLET BY MOUTH EVERY DAY 02/06/23   Elenore Paddy, NP      Allergies    Patient has no known allergies.    Review of Systems   Review of Systems  Constitutional:  Negative for chills and fever.  HENT:  Negative for ear pain and sore throat.   Eyes:  Negative for pain and visual disturbance.  Respiratory:  Negative for cough and shortness of breath.   Cardiovascular:  Positive for chest pain. Negative for palpitations.  Gastrointestinal:  Positive for vomiting. Negative for abdominal pain.  Genitourinary:  Negative for dysuria and hematuria.  Musculoskeletal:  Negative for arthralgias and back pain.  Skin:  Negative for color change and rash.  Neurological:  Negative for seizures and syncope.  All other systems reviewed and are negative.   Physical Exam Updated Vital Signs BP 128/68   Pulse 60   Temp 98.7 F (37.1 C)   Resp 16   Ht 5' 4.5" (1.638 m)   Wt 57.2 kg   SpO2 96%   BMI 21.29 kg/m  Physical Exam Vitals and nursing note reviewed.  Constitutional:      General: She is not in acute distress.    Appearance: She is well-developed.  HENT:     Head: Normocephalic and atraumatic.  Eyes:     Conjunctiva/sclera: Conjunctivae normal.  Cardiovascular:     Rate and Rhythm: Normal rate and regular rhythm.     Heart sounds: No murmur  heard. Pulmonary:     Effort: Pulmonary effort is normal. No respiratory distress.     Breath sounds: Normal breath sounds.  Abdominal:     Palpations: Abdomen is soft.     Tenderness: There is no abdominal tenderness.  Musculoskeletal:        General: No swelling.     Cervical back: Neck supple.  Skin:    General: Skin is warm and dry.     Capillary Refill: Capillary refill takes less than 2 seconds.  Neurological:     Mental Status: She is alert.  Psychiatric:        Mood and Affect: Mood normal.     ED Results / Procedures / Treatments   Labs (all labs ordered are listed, but only abnormal results are displayed) Labs Reviewed  BASIC METABOLIC PANEL - Abnormal; Notable for the following components:      Result Value   Glucose, Bld 162 (*)    All other components within normal limits  CBC  HEPATIC FUNCTION PANEL  LIPASE, BLOOD  TROPONIN I (HIGH SENSITIVITY)  TROPONIN I (HIGH SENSITIVITY)    EKG EKG Interpretation Date/Time:  Wednesday September 03 2023 10:44:01 EDT Ventricular Rate:  66 PR Interval:  150 QRS Duration:  80 QT Interval:  418 QTC Calculation: 438 R Axis:   73  Text Interpretation: Normal sinus rhythm Nonspecific ST abnormality Abnormal ECG No previous ECGs available Confirmed by Edwin Dada ([id number]) on 09/03/2023 11:16:25 AM  Radiology DG Chest 2 View Result Date: 09/03/2023 CLINICAL DATA:  Chest pain.  Vomiting overnight.  Epigastric pain. EXAM: CHEST - 2 VIEW COMPARISON:  Chest two views 08/08/2014 FINDINGS: Cardiac silhouette and mediastinal contours are within limits. The lungs are clear. No pleural effusion pneumothorax. Mild-to-moderate multilevel degenerative disc changes of the thoracic spine. IMPRESSION: No active cardiopulmonary disease. Electronically Signed   By: Neita Garnet M.D.   On: 09/03/2023 12:41    Procedures Procedures    Medications Ordered in ED Medications  sodium chloride 0.9 % bolus 1,000 mL (1,000 mLs Intravenous New  Bag/Given 09/03/23 1202)  ondansetron (ZOFRAN) injection 4 mg (4 mg Intravenous Given 09/03/23 1203)  alum & mag hydroxide-simeth (MAALOX/MYLANTA) 200-200-20 MG/5ML suspension 30 mL (30 mLs Oral Given 09/03/23 1205)  famotidine (PEPCID) tablet 20 mg (20 mg Oral Given 09/03/23 1204)    ED Course/ Medical Decision Making/ A&P                                 Medical Decision Making Amount and/or Complexity of Data Reviewed Labs: ordered. Radiology: ordered.  Risk OTC drugs. Prescription drug management.   68 year old female with past medical history of hyperlipidemia, GERD, anxiety, depression presenting for complaints of chest  discomfort that started after an episode of nausea and vomiting occurred overnight/early this morning.    The patient's chest pain is not suggestive of pulmonary embolus, cardiac ischemia, aortic dissection, pericarditis, myocarditis, pulmonary embolism, pneumothorax, pneumonia, Zoster, or esophageal perforation, or other serious etiology.  Historically not abrupt in onset, tearing or ripping, pulses symmetric. EKG demonstrates sinus rhythm and is nonspecific for ischemia/infarction. No dysrhythmias, brugada, WPW, prolonged QT noted.  Patient monitored on telemetry with no arrhythmias noted.  CXR reviewed and WNL. Troponin negative.  Symptoms started greater than 6 hours ago.  Repeat troponin unnecessary at this time. Labs without demonstration of acute pathology unless otherwise noted above.   Pepcid and Maalox given for possible GERD like symptoms.  All nausea and vomiting has stopped at this time.  Abdomen is soft and nontender.  Liver profile, lipase, and renal function stable.  No abdominal imaging recommended at this time.    Patient in no distress and overall condition improved here in the ED. patient recommended for Prilosec.  Detailed discussions were had with the patient regarding current findings, and need for close f/u with PCP or on call doctor. The patient  has been instructed to return immediately if the symptoms worsen in any way for re-evaluation. Patient verbalized understanding and is in agreement with current care plan. All questions answered prior to discharge.         Final Clinical Impression(s) / ED Diagnoses Final diagnoses:  Gastroesophageal reflux disease, unspecified whether esophagitis present  Chest discomfort  H/O nausea and vomiting    Rx / DC Orders ED Discharge Orders          Ordered    omeprazole (PRILOSEC) 20 MG capsule  Daily        09/03/23 1303              Franne Forts, DO 09/03/23 1303

## 2023-09-03 NOTE — ED Triage Notes (Signed)
 Patient arrives POV with complaints of vomiting overnight, epigastric pain, and right arm pain/tingling. Rates her discomfort a 7/10.

## 2023-09-19 ENCOUNTER — Other Ambulatory Visit: Payer: Self-pay | Admitting: Family

## 2023-09-19 DIAGNOSIS — E785 Hyperlipidemia, unspecified: Secondary | ICD-10-CM

## 2023-10-27 ENCOUNTER — Other Ambulatory Visit: Payer: Self-pay | Admitting: Nurse Practitioner

## 2023-10-27 DIAGNOSIS — B001 Herpesviral vesicular dermatitis: Secondary | ICD-10-CM

## 2024-05-01 ENCOUNTER — Other Ambulatory Visit: Payer: Self-pay | Admitting: Nurse Practitioner

## 2024-05-01 DIAGNOSIS — B001 Herpesviral vesicular dermatitis: Secondary | ICD-10-CM

## 2024-05-16 ENCOUNTER — Other Ambulatory Visit: Payer: Self-pay | Admitting: Nurse Practitioner

## 2024-05-18 ENCOUNTER — Other Ambulatory Visit: Payer: Self-pay | Admitting: Nurse Practitioner

## 2024-05-18 DIAGNOSIS — E785 Hyperlipidemia, unspecified: Secondary | ICD-10-CM

## 2024-05-18 MED ORDER — EZETIMIBE 10 MG PO TABS: 10.0000 mg | ORAL_TABLET | Freq: Every day | ORAL | 1 refills | Status: AC

## 2024-06-10 ENCOUNTER — Emergency Department (HOSPITAL_BASED_OUTPATIENT_CLINIC_OR_DEPARTMENT_OTHER): Admitting: Radiology

## 2024-06-10 ENCOUNTER — Encounter (HOSPITAL_BASED_OUTPATIENT_CLINIC_OR_DEPARTMENT_OTHER): Payer: Self-pay | Admitting: *Deleted

## 2024-06-10 ENCOUNTER — Other Ambulatory Visit: Payer: Self-pay

## 2024-06-10 ENCOUNTER — Emergency Department (HOSPITAL_BASED_OUTPATIENT_CLINIC_OR_DEPARTMENT_OTHER)
Admission: EM | Admit: 2024-06-10 | Discharge: 2024-06-10 | Disposition: A | Discharge status: Home / Self Care | Attending: Emergency Medicine | Admitting: Emergency Medicine

## 2024-06-10 DIAGNOSIS — R059 Cough, unspecified: Secondary | ICD-10-CM | POA: Diagnosis present

## 2024-06-10 DIAGNOSIS — J019 Acute sinusitis, unspecified: Secondary | ICD-10-CM | POA: Diagnosis not present

## 2024-06-10 DIAGNOSIS — B9689 Other specified bacterial agents as the cause of diseases classified elsewhere: Secondary | ICD-10-CM | POA: Diagnosis not present

## 2024-06-10 LAB — RESP PANEL BY RT-PCR (RSV, FLU A&B, COVID)  RVPGX2
Influenza A by PCR: NEGATIVE
Influenza B by PCR: NEGATIVE
Resp Syncytial Virus by PCR: NEGATIVE
SARS Coronavirus 2 by RT PCR: NEGATIVE

## 2024-06-10 MED ORDER — AMOXICILLIN-POT CLAVULANATE 875-125 MG PO TABS: 1.0000 | ORAL_TABLET | Freq: Two times a day (BID) | ORAL | 0 refills | Status: AC

## 2024-06-10 MED ORDER — AMOXICILLIN-POT CLAVULANATE 875-125 MG PO TABS
1.0000 | ORAL_TABLET | Freq: Once | ORAL | Status: AC
  Administered 2024-06-10: 1 via ORAL
  Filled 2024-06-10: qty 1

## 2024-06-10 MED ORDER — FLUTICASONE PROPIONATE 50 MCG/ACT NA SUSP
2.0000 | Freq: Every day | NASAL | Status: DC
  Administered 2024-06-10: 2 via NASAL
  Filled 2024-06-10: qty 16

## 2024-06-10 NOTE — ED Triage Notes (Signed)
 Pt to ED with cough and dizziness since Saturday. Pt reports concern that she has a sinus infection causing her to feel loopy

## 2024-06-10 NOTE — ED Provider Notes (Signed)
 " [place] EMERGENCY DEPARTMENT AT [facility] Provider Note   CSN: [id number] Arrival date & time: 06/10/24  1140     Patient presents with: Cough   Yvette Ferguson is a 69 y.o. female with no significant past medical history presents with concern for sinus congestion that has been ongoing for the past 6 days.  Reports that she cannot seem to get any mucus from her sinuses out.  She reports that she initially had a cough, but this has gradually improved over the past couple days.  Denies any sore throat, fever, chills, abdominal pain, nausea, vomiting, or diarrhea.     Cough      Prior to Admission medications  Medication Sig Start Date End Date Taking? Authorizing Provider  amoxicillin-clavulanate (AUGMENTIN) 875-125 MG tablet Take 1 tablet by mouth 2 (two) times daily for 10 days. 06/10/24 06/20/24 Yes Veta Palma, PA-C  ALPRAZolam  (XANAX ) 0.5 MG tablet Take 0.5 mg by mouth at bedtime as needed for anxiety.    [provider]  ascorbic acid (VITAMIN C) 500 MG tablet Take 500 mg by mouth daily.    [provider]  buPROPion  (WELLBUTRIN  XL) 150 MG 24 hr tablet TAKE 1 TABLET (150 MG TOTAL) BY MOUTH DAILY. KEEP AUGUST APPT FOR FUTURE REFILLS 05/17/24   Elnor Lauraine BRAVO, NP  escitalopram  (LEXAPRO ) 10 MG tablet Take 10 mg by mouth at bedtime.    [provider]  ezetimibe  (ZETIA ) 10 MG tablet Take 1 tablet (10 mg total) by mouth daily. 05/18/24   Elnor Lauraine BRAVO, NP  ibuprofen  (ADVIL ) 800 MG tablet Take 800 mg by mouth every 8 (eight) hours as needed.    [provider]  Multiple Vitamin (MULTIVITAMIN) tablet Take 1 tablet by mouth daily.    [provider]  omeprazole  (PRILOSEC) 20 MG capsule Take 1 capsule (20 mg total) by mouth daily. 09/03/23   Elnor Bernarda SQUIBB, DO  oxyCODONE  (OXY IR/ROXICODONE ) 5 MG immediate release tablet Take 1-2 tablets (5-10 mg total) by mouth every 4 (four) hours as needed for moderate pain (pain score  4-6). Patient not taking: Reported on 06/25/2023 04/01/23   Johnnye Ade, MD  valACYclovir  (VALTREX ) 1000 MG tablet TAKE 1 TABLET BY MOUTH EVERY DAY 05/03/24   Gray, Sarah E, NP    Allergies: Patient has no known allergies.    Review of Systems  Respiratory:  Positive for cough.     Updated Vital Signs BP (!) 140/103 (BP Location: Right Arm)   Pulse 64   Temp 98.7 F (37.1 C) (Oral)   Resp 15   SpO2 97%   Physical Exam Vitals and nursing note reviewed.  Constitutional:      General: She is not in acute distress.    Appearance: She is well-developed.  HENT:     Head: Normocephalic and atraumatic.     Comments: Tender over maxillary sinuses bilaterally    Mouth/Throat:     Pharynx: No oropharyngeal exudate or posterior oropharyngeal erythema.     Comments: NO trismus, swallowing without difficulty Eyes:     Conjunctiva/sclera: Conjunctivae normal.  Cardiovascular:     Rate and Rhythm: Normal rate and regular rhythm.     Heart sounds: No murmur heard. Pulmonary:     Effort: Pulmonary effort is normal. No respiratory distress.     Breath sounds: Normal breath sounds.  Abdominal:     Palpations: Abdomen is soft.     Tenderness: There is no abdominal tenderness.  Musculoskeletal:  General: No swelling.     Cervical back: Neck supple.  Skin:    General: Skin is warm and dry.     Capillary Refill: Capillary refill takes less than 2 seconds.  Neurological:     Mental Status: She is alert.  Psychiatric:        Mood and Affect: Mood normal.     (all labs ordered are listed, but only abnormal results are displayed) Labs Reviewed  RESP PANEL BY RT-PCR (RSV, FLU A&B, COVID)  [id number]    EKG: None  Radiology: DG Chest 2 View Result Date: 06/10/2024 CLINICAL DATA:  Cough. EXAM: DG CHEST 2V COMPARISON:  09/03/2023. FINDINGS: The heart size and mediastinal contours are within normal limits. No focal consolidation, pleural effusion, or pneumothorax. No acute osseous  abnormality. IMPRESSION: No acute cardiopulmonary findings. Electronically Signed   By: Harrietta Sherry M.D.   On: 06/10/2024 12:50     Procedures   Medications Ordered in the ED  amoxicillin-clavulanate (AUGMENTIN) 875-125 MG per tablet 1 tablet (has no administration in time range)  fluticasone  (FLONASE ) 50 MCG/ACT nasal spray 2 spray (has no administration in time range)                                    Medical Decision Making Amount and/or Complexity of Data Reviewed Radiology: ordered.     Differential diagnosis includes but is not limited to COVID, flu, RSV, viral URI, strep pharyngitis, viral pharyngitis, allergic rhinitis, pneumonia, bronchitis, bacterial sinusitis, viral sinusitis   ED Course:  Upon initial evaluation, patient is well-appearing, no acute distress. Tender over maxillary sinuses  Labs Ordered: I Ordered, and personally interpreted labs.  The pertinent results include:    COVID, flu, RSV negative  Imaging Studies ordered: I ordered imaging studies including chest x-ray I independently visualized the imaging with scope of interpretation limited to determining acute life threatening conditions related to emergency care. Imaging showed no acute findings I agree with the radiologist interpretation  Medications Given: Augmentin Flonase    Upon re-evaluation, patient remains well appearing with stable vitals.  Given chest x-ray without consolidations, no fevers, cough improving, doubt pneumonia.  Her COVID, flu, RSV testing is negative.  Her main concern was her sinus pressure.  She is tender over the maxillary sinuses bilaterally.  Her symptoms have been going on for about 1 week now.  At this point, less likely viral sinusitis and more likely a bacterial sinusitis.  Will treat with course of Augmentin. Stable and appropriate for discharge home.     Impression: Sinusitis   Disposition:  Discharged home with instructions to use Augmentin as  prescribed.  May use Flonase  as needed for nasal congestion.  Follow-up with PCP if symptoms not improving within the next 5 days. Return precautions given and patient verbalized understanding.    This chart was dictated using voice recognition software, Dragon. Despite the best efforts of this provider to proofread and correct errors, errors may still occur which can change documentation meaning.       Final diagnoses:  Acute bacterial sinusitis    ED Discharge Orders          Ordered    amoxicillin-clavulanate (AUGMENTIN) 875-125 MG tablet  2 times daily        06/10/24 1304               Veta Palma, PA-C 06/10/24 [address], [place],  MD 06/10/24 1458  "

## 2024-06-10 NOTE — Discharge Instructions (Addendum)
 You have been prescribed an antibiotic called Augmentin to treat your sinus infection. Take this antibiotic 2 times a day for the next 10 days. You were given your first dose here today. Take your next dose this evening. Take the full course of your antibiotic even if you start feeling better. Antibiotics may cause you to have diarrhea.  You may use 2 puffs of Flonase  (fluticasone ) in each nostril daily for nasal congestion. This is an over the counter medication  Please follow-up with your PCP if symptoms are not improving within the next 5 days  Return to the ER for any other new or concerning symptoms

## 2024-06-25 ENCOUNTER — Ambulatory Visit (INDEPENDENT_AMBULATORY_CARE_PROVIDER_SITE_OTHER): Payer: PPO

## 2024-06-25 VITALS — BP 118/78 | HR 69 | Temp 98.1°F | Ht 63.75 in | Wt 132.8 lb

## 2024-06-25 DIAGNOSIS — Z0000 Encounter for general adult medical examination without abnormal findings: Secondary | ICD-10-CM

## 2024-06-25 NOTE — Progress Notes (Signed)
 "  Chief Complaint  Patient presents with   Medicare Wellness     Subjective:   Yvette Ferguson is a 69 y.o. female who presents for a Medicare Annual Wellness Visit.  Visit info / Clinical Intake: Medicare Wellness Visit Type:: Subsequent Annual Wellness Visit Persons participating in visit and providing information:: patient Medicare Wellness Visit Mode:: In-person (required for WTM) Interpreter Needed?: No Pre-visit prep was completed: yes AWV questionnaire completed by patient prior to visit?: yes Date:: 06/22/24 Living arrangements:: (!) (Patient-Rptd) lives alone Patient's Overall Health Status Rating: (Patient-Rptd) very good Typical amount of pain: (Patient-Rptd) some Does pain affect daily life?: (Patient-Rptd) no Are you currently prescribed opioids?: no  Dietary Habits and Nutritional Risks How many meals a day?: (Patient-Rptd) 2 Eats fruit and vegetables daily?: (Patient-Rptd) yes Most meals are obtained by: (Patient-Rptd) preparing own meals In the last 2 weeks, have you had any of the following?: none Diabetic:: no  Functional Status Activities of Daily Living (to include ambulation/medication): (Patient-Rptd) Independent Ambulation: (Patient-Rptd) Independent Medication Administration: (Patient-Rptd) Independent Home Management (perform basic housework or laundry): (Patient-Rptd) Independent Manage your own finances?: (Patient-Rptd) yes Primary transportation is: (Patient-Rptd) driving Concerns about vision?: no *vision screening is required for WTM* Concerns about hearing?: no  Fall Screening Falls in the past year?: (Patient-Rptd) 0 Number of falls in past year: 0 Was there an injury with Fall?: 0 Fall Risk Category Calculator: 0 Patient Fall Risk Level: Low Fall Risk  Fall Risk Patient at Risk for Falls Due to: Medication side effect Fall risk Follow up: Falls prevention discussed; Falls evaluation completed  Home and Transportation  Safety: All rugs have non-skid backing?: (Patient-Rptd) yes All stairs or steps have railings?: (Patient-Rptd) yes Grab bars in the bathtub or shower?: (!) (Patient-Rptd) no Have non-skid surface in bathtub or shower?: (!) (Patient-Rptd) no Good home lighting?: (Patient-Rptd) yes Regular seat belt use?: (Patient-Rptd) yes Hospital stays in the last year:: no How many hospital stays:: (Patient-Rptd) 1  Cognitive Assessment Difficulty concentrating, remembering, or making decisions? : (Patient-Rptd) no Will 6CIT or Mini Cog be Completed: yes What year is it?: 0 points What month is it?: 0 points Give patient an address phrase to remember (5 components): [address] [place] About what time is it?: 0 points Count backwards from 20 to 1: 0 points Say the months of the year in reverse: 0 points Repeat the address phrase from earlier: 2 points 6 CIT Score: 2 points  Advance Directives (For Healthcare) Does Patient Have a Medical Advance Directive?: Yes Does patient want to make changes to medical advance directive?: No - Patient declined Type of Advance Directive: Healthcare Power of [place]; Living will Copy of Healthcare Power of Attorney in Chart?: Yes - validated most recent copy scanned in chart (See row information) Copy of Living Will in Chart?: Yes - validated most recent copy scanned in chart (See row information) Would patient like information on creating a medical advance directive?: No - Patient declined  Reviewed/Updated  Reviewed/Updated: Reviewed All (Medical, Surgical, Family, Medications, Allergies, Care Teams, Patient Goals)    Allergies (verified) Patient has no known allergies.   Current Medications (verified) Outpatient Encounter Medications as of 06/25/2024  Medication Sig   ALPRAZolam  (XANAX ) 0.5 MG tablet Take 0.5 mg by mouth at bedtime as needed for anxiety.   ascorbic acid (VITAMIN C) 500 MG tablet Take 500 mg by mouth daily.   buPROPion  (WELLBUTRIN   XL) 150 MG 24 hr tablet TAKE 1 TABLET (150 MG TOTAL)  BY MOUTH DAILY. KEEP AUGUST APPT FOR FUTURE REFILLS   escitalopram  (LEXAPRO ) 10 MG tablet Take 10 mg by mouth at bedtime.   ezetimibe  (ZETIA ) 10 MG tablet Take 1 tablet (10 mg total) by mouth daily.   ibuprofen  (ADVIL ) 800 MG tablet Take 800 mg by mouth every 8 (eight) hours as needed.   Multiple Vitamin (MULTIVITAMIN) tablet Take 1 tablet by mouth daily.   omeprazole  (PRILOSEC) 20 MG capsule Take 1 capsule (20 mg total) by mouth daily.   valACYclovir  (VALTREX ) 1000 MG tablet TAKE 1 TABLET BY MOUTH EVERY DAY   oxyCODONE  (OXY IR/ROXICODONE ) 5 MG immediate release tablet Take 1-2 tablets (5-10 mg total) by mouth every 4 (four) hours as needed for moderate pain (pain score 4-6). (Patient not taking: Reported on 06/25/2024)   No facility-administered encounter medications on file as of 06/25/2024.    History: Past Medical History:  Diagnosis Date   Anxiety    Depression    Essential tremor    GERD (gastroesophageal reflux disease)    Herpes labialis    chronic, intermittant   History of gastroesophageal reflux (GERD)    Hyperlipidemia    OA (osteoarthritis)    Ovarian cyst, bilateral    Wears glasses    Past Surgical History:  Procedure Laterality Date   ABDOMINAL HYSTERECTOMY  2001   BLEPHAROPLASTY Bilateral 2007   upper eyelid   BREAST ENHANCEMENT SURGERY Bilateral 1999   w/ implants   BREAST SURGERY  2000   COLONOSCOPY  2011   COSMETIC SURGERY  2007   Eyes   JOINT REPLACEMENT  2021   Hip   LAPAROSCOPIC ASSISTED VAGINAL HYSTERECTOMY  2001   SALPINGOOPHORECTOMY Bilateral 03/31/2023   Procedure: OPEN SALPINGO OOPHORECTOMY;  Surgeon: Johnnye Ade, MD;  Location: [facility];  Service: Gynecology;  Laterality: Bilateral;   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   Procedure: TOTAL HIP ARTHROPLASTY ANTERIOR APPROACH;  Surgeon: Ernie Cough, MD;  Location: [facility];  Service: Orthopedics;  Laterality: Left;  70  mins   Family History  Problem Relation Age of Onset   Diabetes Mother    Pancreatic cancer Mother    Hypertension Mother    Cancer Mother    Diabetes Father    Leukemia Father    Hypertension Father    Cancer Sister    Social History   Occupational History   Occupation: Self employed/ Simply organized and clean  Tobacco Use   Smoking status: Never   Smokeless tobacco: Never  Vaping Use   Vaping status: Never Used  Substance and Sexual Activity   Alcohol use: Yes    Alcohol/week: 7.0 standard drinks of alcohol    Types: 7 Glasses of wine per week    Comment: 03-20-2023  per pt one bottle wine per week   Drug use: Never   Sexual activity: Not Currently    Birth control/protection: Surgical    Comment: Hysterectomy   Tobacco Counseling Counseling given: Not Answered  SDOH Screenings   Food Insecurity: No Food Insecurity (06/22/2024)  Housing: Low Risk (06/22/2024)  Transportation Needs: No Transportation Needs (06/22/2024)  Utilities: Not At Risk (06/25/2024)  Alcohol Screen: Low Risk (06/22/2024)  Depression (PHQ2-9): Low Risk (06/25/2024)  Financial Resource Strain: Low Risk (06/22/2024)  Physical Activity: Inactive (06/22/2024)  Social Connections: Moderately Integrated (06/22/2024)  Stress: No Stress Concern Present (06/22/2024)  Tobacco Use: Low Risk (06/25/2024)  Health Literacy: Adequate Health Literacy (06/25/2024)   See flowsheets for full screening details  Depression Screen PHQ  2 & 9 Depression Scale- Over the past 2 weeks, how often have you been bothered by any of the following problems? Little interest or pleasure in doing things: 0 Feeling down, depressed, or hopeless (PHQ Adolescent also includes...irritable): 0 PHQ-2 Total Score: 0 Trouble falling or staying asleep, or sleeping too much: 0 Feeling tired or having little energy: 0 Poor appetite or overeating (PHQ Adolescent also includes...weight loss): 0 Feeling bad about yourself - or that you are a  failure or have let yourself or your family down: 0 Trouble concentrating on things, such as reading the newspaper or watching television (PHQ Adolescent also includes...like school work): 0 Moving or speaking so slowly that other people could have noticed. Or the opposite - being so fidgety or restless that you have been moving around a lot more than usual: 0 Thoughts that you would be better off dead, or of hurting yourself in some way: 0 PHQ-9 Total Score: 0 If you checked off any problems, how difficult have these problems made it for you to do your work, take care of things at home, or get along with other people?: Not difficult at all  Depression Treatment Depression Interventions/Treatment : [id number] Score <4 Follow-up Not Indicated     Goals Addressed               This Visit's Progress     Get back in to walking the dog and lifting weights (pt-stated)               Objective:    Today's Vitals   06/25/24 0820  BP: 118/78  Pulse: 69  Temp: 98.1 F (36.7 C)  TempSrc: Oral  SpO2: 97%  Weight: 132 lb 12.8 oz (60.2 kg)  Height: 5' 3.75 (1.619 m)   Body mass index is 22.97 kg/m.  Hearing/Vision screen Vision Screening - Comments:: Regular eye exams Immunizations and Health Maintenance Health Maintenance  Topic Date Due   Mammogram  03/18/2024   DTaP/Tdap/Td (2 - Td or Tdap) 02/26/2027   Colonoscopy  02/08/2031   Bone Density Scan  Completed   Hepatitis C Screening  Completed   Meningococcal B Vaccine  Aged Out   Pneumococcal Vaccine: 50+ Years  Discontinued   Influenza Vaccine  Discontinued   COVID-19 Vaccine  Discontinued   Zoster Vaccines- Shingrix  Discontinued        Assessment/Plan:  This is a routine wellness examination for [facility].  Patient Care Team: Elnor Lauraine BRAVO, NP as PCP - General (Nurse Practitioner) [facility], [place] Johnnye Ade, MD as Consulting Physician (Obstetrics and Gynecology)  I have personally reviewed and  noted the following in the patients chart:   Medical and social history Use of alcohol, tobacco or illicit drugs  Current medications and supplements including opioid prescriptions. Functional ability and status Nutritional status Physical activity Advanced directives List of other physicians Hospitalizations, surgeries, and ER visits in previous 12 months Vitals Screenings to include cognitive, depression, and falls Referrals and appointments  No orders of the defined types were placed in this encounter.  In addition, I have reviewed and discussed with patient certain preventive protocols, quality metrics, and best practice recommendations. A written personalized care plan for preventive services as well as general preventive health recommendations were provided to patient.   Ardella BRAVO Dawn, LPN   8/83/7973   Return in 1 year (on 06/25/2025).  After Visit Summary: (In Person-Printed) AVS printed and given to the patient  Nurse Notes: Screenings not ordered:  Already had mammogram screening - Records requested - Awaiting results  "

## 2024-06-25 NOTE — Patient Instructions (Signed)
 Yvette Ferguson,  Thank you for taking the time for your Medicare Wellness Visit. I appreciate your continued commitment to your health goals. Please review the care plan we discussed, and feel free to reach out if I can assist you further.  Please note that Annual Wellness Visits do not include a physical exam. Some assessments may be limited, especially if the visit was conducted virtually. If needed, we may recommend an in-person follow-up with your provider.  Ongoing Care Seeing your primary care provider every 3 to 6 months helps us  monitor your health and provide consistent, personalized care.   Referrals If a referral was made during today's visit and you haven't received any updates within two weeks, please contact the referred provider directly to check on the status.  Recommended Screenings:  Health Maintenance  Topic Date Due   Breast Cancer Screening  03/18/2024   DTaP/Tdap/Td vaccine (2 - Td or Tdap) 02/26/2027   Colon Cancer Screening  02/08/2031   Osteoporosis screening with Bone Density Scan  Completed   Hepatitis C Screening  Completed   Meningitis B Vaccine  Aged Out   Pneumococcal Vaccine for age over 92  Discontinued   Flu Shot  Discontinued   COVID-19 Vaccine  Discontinued   Zoster (Shingles) Vaccine  Discontinued       06/22/2024    7:40 AM  Advanced Directives  Does Patient Have a Medical Advance Directive? Yes  Type of Estate Agent of [place];Living will  Does patient want to make changes to medical advance directive? No - Patient declined  Copy of Healthcare Power of Attorney in Chart? Yes - validated most recent copy scanned in chart (See row information)    Vision: Annual vision screenings are recommended for early detection of glaucoma, cataracts, and diabetic retinopathy. These exams can also reveal signs of chronic conditions such as diabetes and high blood pressure.  Dental: Annual dental screenings help detect early signs of  oral cancer, gum disease, and other conditions linked to overall health, including heart disease and diabetes.  Please see the attached documents for additional preventive care recommendations.
